# Patient Record
Sex: Male | Born: 1940 | Race: White | Hispanic: No | Marital: Married | Smoking: Former smoker
Health system: Southern US, Community
[De-identification: ages and names within clinical notes are randomized; demographics above are authoritative.]

## PROBLEM LIST (undated history)

## (undated) DIAGNOSIS — K225 Diverticulum of esophagus, acquired: Secondary | ICD-10-CM

## (undated) DIAGNOSIS — T7840XA Allergy, unspecified, initial encounter: Secondary | ICD-10-CM

## (undated) DIAGNOSIS — K219 Gastro-esophageal reflux disease without esophagitis: Secondary | ICD-10-CM

## (undated) DIAGNOSIS — E785 Hyperlipidemia, unspecified: Secondary | ICD-10-CM

## (undated) DIAGNOSIS — M109 Gout, unspecified: Secondary | ICD-10-CM

## (undated) DIAGNOSIS — M1990 Unspecified osteoarthritis, unspecified site: Secondary | ICD-10-CM

## (undated) HISTORY — PX: COLONOSCOPY: SHX174

## (undated) HISTORY — DX: Diverticulum of esophagus, acquired: K22.5

## (undated) HISTORY — DX: Hyperlipidemia, unspecified: E78.5

## (undated) HISTORY — DX: Gout, unspecified: M10.9

## (undated) HISTORY — DX: Allergy, unspecified, initial encounter: T78.40XA

## (undated) HISTORY — PX: HERNIA REPAIR: SHX51

## (undated) HISTORY — DX: Unspecified osteoarthritis, unspecified site: M19.90

## (undated) HISTORY — DX: Gastro-esophageal reflux disease without esophagitis: K21.9

---

## 2002-08-30 ENCOUNTER — Ambulatory Visit (HOSPITAL_COMMUNITY): Admission: RE | Admit: 2002-08-30 | Discharge: 2002-08-30 | Payer: Self-pay | Admitting: *Deleted

## 2014-03-10 ENCOUNTER — Other Ambulatory Visit: Payer: Self-pay | Admitting: Family Medicine

## 2014-03-10 ENCOUNTER — Ambulatory Visit
Admission: RE | Admit: 2014-03-10 | Discharge: 2014-03-10 | Disposition: A | Discharge status: Home / Self Care | Payer: Medicare Other | Source: Ambulatory Visit | Attending: Family Medicine | Admitting: Family Medicine

## 2014-03-10 DIAGNOSIS — R52 Pain, unspecified: Secondary | ICD-10-CM

## 2014-10-30 ENCOUNTER — Other Ambulatory Visit: Payer: Self-pay | Admitting: Family Medicine

## 2014-10-30 DIAGNOSIS — R229 Localized swelling, mass and lump, unspecified: Principal | ICD-10-CM

## 2014-10-30 DIAGNOSIS — IMO0002 Reserved for concepts with insufficient information to code with codable children: Secondary | ICD-10-CM

## 2014-11-09 ENCOUNTER — Ambulatory Visit
Admission: RE | Admit: 2014-11-09 | Discharge: 2014-11-09 | Disposition: A | Discharge status: Home / Self Care | Payer: Medicare Other | Source: Ambulatory Visit | Attending: Family Medicine | Admitting: Family Medicine

## 2014-11-09 DIAGNOSIS — IMO0002 Reserved for concepts with insufficient information to code with codable children: Secondary | ICD-10-CM

## 2014-11-09 DIAGNOSIS — R229 Localized swelling, mass and lump, unspecified: Principal | ICD-10-CM

## 2014-11-09 MED ORDER — GADOBENATE DIMEGLUMINE 529 MG/ML IV SOLN
15.0000 mL | Freq: Once | INTRAVENOUS | Status: AC | PRN
  Administered 2014-11-09: 15 mL via INTRAVENOUS

## 2015-06-14 DIAGNOSIS — R69 Illness, unspecified: Secondary | ICD-10-CM | POA: Diagnosis not present

## 2015-08-11 DIAGNOSIS — J019 Acute sinusitis, unspecified: Secondary | ICD-10-CM | POA: Diagnosis not present

## 2015-08-22 DIAGNOSIS — R7301 Impaired fasting glucose: Secondary | ICD-10-CM | POA: Diagnosis not present

## 2015-08-22 DIAGNOSIS — R972 Elevated prostate specific antigen [PSA]: Secondary | ICD-10-CM | POA: Diagnosis not present

## 2015-08-22 DIAGNOSIS — Z125 Encounter for screening for malignant neoplasm of prostate: Secondary | ICD-10-CM | POA: Diagnosis not present

## 2015-08-22 DIAGNOSIS — Z0000 Encounter for general adult medical examination without abnormal findings: Secondary | ICD-10-CM | POA: Diagnosis not present

## 2015-08-22 DIAGNOSIS — E782 Mixed hyperlipidemia: Secondary | ICD-10-CM | POA: Diagnosis not present

## 2015-08-22 DIAGNOSIS — Z79899 Other long term (current) drug therapy: Secondary | ICD-10-CM | POA: Diagnosis not present

## 2015-08-22 DIAGNOSIS — M109 Gout, unspecified: Secondary | ICD-10-CM | POA: Diagnosis not present

## 2015-08-22 DIAGNOSIS — D696 Thrombocytopenia, unspecified: Secondary | ICD-10-CM | POA: Diagnosis not present

## 2015-11-07 DIAGNOSIS — R972 Elevated prostate specific antigen [PSA]: Secondary | ICD-10-CM | POA: Diagnosis not present

## 2015-11-07 DIAGNOSIS — M109 Gout, unspecified: Secondary | ICD-10-CM | POA: Diagnosis not present

## 2015-11-07 DIAGNOSIS — E782 Mixed hyperlipidemia: Secondary | ICD-10-CM | POA: Diagnosis not present

## 2015-11-07 DIAGNOSIS — R7301 Impaired fasting glucose: Secondary | ICD-10-CM | POA: Diagnosis not present

## 2015-11-07 DIAGNOSIS — D696 Thrombocytopenia, unspecified: Secondary | ICD-10-CM | POA: Diagnosis not present

## 2015-11-16 DIAGNOSIS — E782 Mixed hyperlipidemia: Secondary | ICD-10-CM | POA: Diagnosis not present

## 2015-11-16 DIAGNOSIS — D696 Thrombocytopenia, unspecified: Secondary | ICD-10-CM | POA: Diagnosis not present

## 2015-11-16 DIAGNOSIS — R7301 Impaired fasting glucose: Secondary | ICD-10-CM | POA: Diagnosis not present

## 2015-11-16 DIAGNOSIS — H40013 Open angle with borderline findings, low risk, bilateral: Secondary | ICD-10-CM | POA: Diagnosis not present

## 2015-11-16 DIAGNOSIS — N400 Enlarged prostate without lower urinary tract symptoms: Secondary | ICD-10-CM | POA: Diagnosis not present

## 2015-11-16 DIAGNOSIS — Z0000 Encounter for general adult medical examination without abnormal findings: Secondary | ICD-10-CM | POA: Diagnosis not present

## 2015-11-16 DIAGNOSIS — R972 Elevated prostate specific antigen [PSA]: Secondary | ICD-10-CM | POA: Diagnosis not present

## 2015-11-16 DIAGNOSIS — Z8739 Personal history of other diseases of the musculoskeletal system and connective tissue: Secondary | ICD-10-CM | POA: Diagnosis not present

## 2015-11-16 DIAGNOSIS — H2513 Age-related nuclear cataract, bilateral: Secondary | ICD-10-CM | POA: Diagnosis not present

## 2015-11-16 DIAGNOSIS — H6120 Impacted cerumen, unspecified ear: Secondary | ICD-10-CM | POA: Diagnosis not present

## 2015-12-17 DIAGNOSIS — R69 Illness, unspecified: Secondary | ICD-10-CM | POA: Diagnosis not present

## 2016-01-11 DIAGNOSIS — N401 Enlarged prostate with lower urinary tract symptoms: Secondary | ICD-10-CM | POA: Diagnosis not present

## 2016-01-11 DIAGNOSIS — R3912 Poor urinary stream: Secondary | ICD-10-CM | POA: Diagnosis not present

## 2016-01-11 DIAGNOSIS — R3915 Urgency of urination: Secondary | ICD-10-CM | POA: Diagnosis not present

## 2016-01-11 DIAGNOSIS — R972 Elevated prostate specific antigen [PSA]: Secondary | ICD-10-CM | POA: Diagnosis not present

## 2016-01-17 DIAGNOSIS — Z23 Encounter for immunization: Secondary | ICD-10-CM | POA: Diagnosis not present

## 2016-03-13 DIAGNOSIS — Z872 Personal history of diseases of the skin and subcutaneous tissue: Secondary | ICD-10-CM | POA: Diagnosis not present

## 2016-03-13 DIAGNOSIS — D1801 Hemangioma of skin and subcutaneous tissue: Secondary | ICD-10-CM | POA: Diagnosis not present

## 2016-03-13 DIAGNOSIS — L814 Other melanin hyperpigmentation: Secondary | ICD-10-CM | POA: Diagnosis not present

## 2016-03-13 DIAGNOSIS — L821 Other seborrheic keratosis: Secondary | ICD-10-CM | POA: Diagnosis not present

## 2016-03-13 DIAGNOSIS — Z85828 Personal history of other malignant neoplasm of skin: Secondary | ICD-10-CM | POA: Diagnosis not present

## 2016-06-26 DIAGNOSIS — R69 Illness, unspecified: Secondary | ICD-10-CM | POA: Diagnosis not present

## 2016-11-18 DIAGNOSIS — H2513 Age-related nuclear cataract, bilateral: Secondary | ICD-10-CM | POA: Diagnosis not present

## 2016-11-18 DIAGNOSIS — H52223 Regular astigmatism, bilateral: Secondary | ICD-10-CM | POA: Diagnosis not present

## 2016-11-18 DIAGNOSIS — H40013 Open angle with borderline findings, low risk, bilateral: Secondary | ICD-10-CM | POA: Diagnosis not present

## 2016-11-25 DIAGNOSIS — Z125 Encounter for screening for malignant neoplasm of prostate: Secondary | ICD-10-CM | POA: Diagnosis not present

## 2016-11-25 DIAGNOSIS — M109 Gout, unspecified: Secondary | ICD-10-CM | POA: Diagnosis not present

## 2016-11-25 DIAGNOSIS — Z79899 Other long term (current) drug therapy: Secondary | ICD-10-CM | POA: Diagnosis not present

## 2016-11-25 DIAGNOSIS — E782 Mixed hyperlipidemia: Secondary | ICD-10-CM | POA: Diagnosis not present

## 2016-11-28 DIAGNOSIS — R972 Elevated prostate specific antigen [PSA]: Secondary | ICD-10-CM | POA: Diagnosis not present

## 2016-11-28 DIAGNOSIS — Z6827 Body mass index (BMI) 27.0-27.9, adult: Secondary | ICD-10-CM | POA: Diagnosis not present

## 2016-11-28 DIAGNOSIS — E782 Mixed hyperlipidemia: Secondary | ICD-10-CM | POA: Diagnosis not present

## 2016-11-28 DIAGNOSIS — D696 Thrombocytopenia, unspecified: Secondary | ICD-10-CM | POA: Diagnosis not present

## 2016-11-28 DIAGNOSIS — Z0000 Encounter for general adult medical examination without abnormal findings: Secondary | ICD-10-CM | POA: Diagnosis not present

## 2016-11-28 DIAGNOSIS — Z8739 Personal history of other diseases of the musculoskeletal system and connective tissue: Secondary | ICD-10-CM | POA: Diagnosis not present

## 2016-11-28 DIAGNOSIS — E663 Overweight: Secondary | ICD-10-CM | POA: Diagnosis not present

## 2016-11-28 DIAGNOSIS — Z79899 Other long term (current) drug therapy: Secondary | ICD-10-CM | POA: Diagnosis not present

## 2016-11-28 DIAGNOSIS — H6121 Impacted cerumen, right ear: Secondary | ICD-10-CM | POA: Diagnosis not present

## 2016-11-28 DIAGNOSIS — R7301 Impaired fasting glucose: Secondary | ICD-10-CM | POA: Diagnosis not present

## 2017-02-09 DIAGNOSIS — R69 Illness, unspecified: Secondary | ICD-10-CM | POA: Diagnosis not present

## 2017-03-02 DIAGNOSIS — R7301 Impaired fasting glucose: Secondary | ICD-10-CM | POA: Diagnosis not present

## 2017-03-02 DIAGNOSIS — E782 Mixed hyperlipidemia: Secondary | ICD-10-CM | POA: Diagnosis not present

## 2017-03-02 DIAGNOSIS — R972 Elevated prostate specific antigen [PSA]: Secondary | ICD-10-CM | POA: Diagnosis not present

## 2017-03-02 DIAGNOSIS — Z79899 Other long term (current) drug therapy: Secondary | ICD-10-CM | POA: Diagnosis not present

## 2017-03-02 DIAGNOSIS — Z0000 Encounter for general adult medical examination without abnormal findings: Secondary | ICD-10-CM | POA: Diagnosis not present

## 2017-03-02 DIAGNOSIS — D696 Thrombocytopenia, unspecified: Secondary | ICD-10-CM | POA: Diagnosis not present

## 2017-03-02 DIAGNOSIS — H6120 Impacted cerumen, unspecified ear: Secondary | ICD-10-CM | POA: Diagnosis not present

## 2017-03-02 DIAGNOSIS — Z8739 Personal history of other diseases of the musculoskeletal system and connective tissue: Secondary | ICD-10-CM | POA: Diagnosis not present

## 2017-03-09 DIAGNOSIS — R3912 Poor urinary stream: Secondary | ICD-10-CM | POA: Diagnosis not present

## 2017-03-09 DIAGNOSIS — R972 Elevated prostate specific antigen [PSA]: Secondary | ICD-10-CM | POA: Diagnosis not present

## 2017-03-09 DIAGNOSIS — N401 Enlarged prostate with lower urinary tract symptoms: Secondary | ICD-10-CM | POA: Diagnosis not present

## 2017-04-27 DIAGNOSIS — L814 Other melanin hyperpigmentation: Secondary | ICD-10-CM | POA: Diagnosis not present

## 2017-04-27 DIAGNOSIS — D1801 Hemangioma of skin and subcutaneous tissue: Secondary | ICD-10-CM | POA: Diagnosis not present

## 2017-04-27 DIAGNOSIS — Z872 Personal history of diseases of the skin and subcutaneous tissue: Secondary | ICD-10-CM | POA: Diagnosis not present

## 2017-04-27 DIAGNOSIS — C44329 Squamous cell carcinoma of skin of other parts of face: Secondary | ICD-10-CM | POA: Diagnosis not present

## 2017-04-27 DIAGNOSIS — Z85828 Personal history of other malignant neoplasm of skin: Secondary | ICD-10-CM | POA: Diagnosis not present

## 2017-04-27 DIAGNOSIS — L821 Other seborrheic keratosis: Secondary | ICD-10-CM | POA: Diagnosis not present

## 2017-04-27 DIAGNOSIS — D485 Neoplasm of uncertain behavior of skin: Secondary | ICD-10-CM | POA: Diagnosis not present

## 2017-05-26 DIAGNOSIS — L905 Scar conditions and fibrosis of skin: Secondary | ICD-10-CM | POA: Diagnosis not present

## 2017-05-26 DIAGNOSIS — C44329 Squamous cell carcinoma of skin of other parts of face: Secondary | ICD-10-CM | POA: Diagnosis not present

## 2017-07-30 ENCOUNTER — Ambulatory Visit (INDEPENDENT_AMBULATORY_CARE_PROVIDER_SITE_OTHER): Payer: MEDICARE | Admitting: Orthopaedic Surgery

## 2017-07-30 ENCOUNTER — Encounter (INDEPENDENT_AMBULATORY_CARE_PROVIDER_SITE_OTHER): Payer: Self-pay

## 2017-07-30 ENCOUNTER — Ambulatory Visit (INDEPENDENT_AMBULATORY_CARE_PROVIDER_SITE_OTHER): Payer: MEDICARE

## 2017-07-30 ENCOUNTER — Encounter (INDEPENDENT_AMBULATORY_CARE_PROVIDER_SITE_OTHER): Payer: Self-pay | Admitting: Orthopaedic Surgery

## 2017-07-30 VITALS — BP 140/65 | HR 79 | Ht 68.0 in | Wt 175.0 lb

## 2017-07-30 DIAGNOSIS — M1712 Unilateral primary osteoarthritis, left knee: Secondary | ICD-10-CM | POA: Diagnosis not present

## 2017-07-30 DIAGNOSIS — M25562 Pain in left knee: Secondary | ICD-10-CM | POA: Diagnosis not present

## 2017-07-30 MED ORDER — METHYLPREDNISOLONE ACETATE 40 MG/ML IJ SUSP
80.0000 mg | INTRAMUSCULAR | Status: AC | PRN
  Administered 2017-07-30: 80 mg

## 2017-07-30 MED ORDER — BUPIVACAINE HCL 0.5 % IJ SOLN
2.0000 mL | INTRAMUSCULAR | Status: AC | PRN
  Administered 2017-07-30: 2 mL via INTRA_ARTICULAR

## 2017-07-30 MED ORDER — LIDOCAINE HCL 1 % IJ SOLN
2.0000 mL | INTRAMUSCULAR | Status: AC | PRN
  Administered 2017-07-30: 2 mL

## 2017-07-30 NOTE — Progress Notes (Signed)
Office Visit Note   Patient: Gary Arroyo           Date of Birth: 1940-12-14           MRN: [id number] Visit Date: 07/30/2017              Requested by: No referring provider defined for this encounter. PCP: Hulan Fess, MD   Assessment & Plan: Visit Diagnoses:  1. Unilateral primary osteoarthritis, left knee   2. Left knee pain, unspecified chronicity     Plan:  #1: Corticosteroid injection to the left knee was accomplished and he had improvement in his symptoms. 2: If he does not have improvement he will call and we will schedule an MRI scan of his left knee.   Follow-Up Instructions: Return if symptoms worsen or fail to improve.   Orders:  Orders Placed This Encounter  Procedures  . Large Joint Inj: L knee  . XR Knee 1-2 Views Left   No orders of the defined types were placed in this encounter.     Procedures: Large Joint Inj: L knee on 07/30/2017 11:08 AM Indications: pain and diagnostic evaluation Details: 25 G 1.5 in needle, anteromedial approach  Arthrogram: No  Medications: 2 mL lidocaine 1 %; 2 mL bupivacaine 0.5 %; 80 mg methylPREDNISolone acetate 40 MG/ML Procedure, treatment alternatives, risks and benefits explained, specific risks discussed. Consent was given by the patient. Patient was prepped and draped in the usual sterile fashion.       Clinical Data: No additional findings.   Subjective: Chief Complaint  Patient presents with  . Left Knee - Pain  . Knee Pain    Pt stated Lt knee painful especially at night/walking    HPI  Gary Arroyo is a very pleasant 77 year old white male who is seen today for evaluation of his left knee.  He started having pain discomfort in the left knee especially at nighttime.  This discomfort was mainly along the medial aspect of the left knee.  He denies any history of injury or trauma.  It actually bothers him is also as he is walking.  He denies any catching locking or popping or giving way at this time.  No  hip or groin pain.  Review of Systems   Objective: Vital Signs: BP 140/65   Pulse 79   Ht 5\' 8"  (1.727 m)   Wt 175 lb (79.4 kg)   BMI 26.61 kg/m   Physical Exam  Constitutional: He is oriented to person, place, and time. He appears well-developed and well-nourished.  HENT:  Head: Normocephalic and atraumatic.  Eyes: Pupils are equal, round, and reactive to light. EOM are normal.  Pulmonary/Chest: Effort normal.  Neurological: He is alert and oriented to person, place, and time.  Skin: Skin is warm and dry.  Psychiatric: He has a normal mood and affect. His behavior is normal. Judgment and thought content normal.    Ortho Exam  Today he has range of motion a few degrees shy of full extension to 100 degrees.  Tender to palpation over the medial joint line.  McMurray's is mildly positive.  Ligamentously stable though has a little bit of opening with valgus stressing which has a good endpoint.  Trace effusion.  Calf supple nontender.  Good motion of the both hips.  Specialty Comments:  No specialty comments available.  Imaging: Xr Knee 1-2 Views Left  Result Date: 07/30/2017 2 view x-ray of the left knee reveals decreased joint space of the medial compartment.  Still maintaining good joint space.  Little bit of a periarticular spur on the lateral proximal tibial plateau.    PMFS History: There are no active problems to display for this patient.  No past medical history on file.  No family history on file.   Social History   Occupational History  . Not on file  Tobacco Use  . Smoking status: Never Smoker  . Smokeless tobacco: Never Used  Substance and Sexual Activity  . Alcohol use: Yes    Comment: once every 2 years  . Drug use: Not Currently  . Sexual activity: Not Currently

## 2017-09-09 ENCOUNTER — Telehealth (INDEPENDENT_AMBULATORY_CARE_PROVIDER_SITE_OTHER): Payer: Self-pay | Admitting: Orthopaedic Surgery

## 2017-09-09 NOTE — Telephone Encounter (Signed)
Per wife, patient had injection 6 weeks ago. Lt. knee is no better, and would like to proceed with MRI. Please call to advise.

## 2017-09-10 NOTE — Telephone Encounter (Signed)
Please advise 

## 2017-09-11 ENCOUNTER — Other Ambulatory Visit (INDEPENDENT_AMBULATORY_CARE_PROVIDER_SITE_OTHER): Payer: Self-pay | Admitting: Radiology

## 2017-09-11 DIAGNOSIS — G8929 Other chronic pain: Secondary | ICD-10-CM

## 2017-09-11 DIAGNOSIS — M25562 Pain in left knee: Principal | ICD-10-CM

## 2017-09-11 NOTE — Telephone Encounter (Signed)
SENT L KNEE MRI REFERRAL IN

## 2017-09-11 NOTE — Telephone Encounter (Signed)
Ok for MRI.

## 2017-09-19 ENCOUNTER — Ambulatory Visit
Admission: RE | Admit: 2017-09-19 | Discharge: 2017-09-19 | Disposition: A | Discharge status: Home / Self Care | Payer: Medicare Other | Source: Ambulatory Visit | Attending: Orthopaedic Surgery | Admitting: Orthopaedic Surgery

## 2017-09-19 DIAGNOSIS — M25562 Pain in left knee: Secondary | ICD-10-CM | POA: Diagnosis not present

## 2017-09-19 DIAGNOSIS — G8929 Other chronic pain: Secondary | ICD-10-CM

## 2017-09-25 ENCOUNTER — Encounter (INDEPENDENT_AMBULATORY_CARE_PROVIDER_SITE_OTHER): Payer: Self-pay | Admitting: Orthopaedic Surgery

## 2017-09-25 ENCOUNTER — Ambulatory Visit (INDEPENDENT_AMBULATORY_CARE_PROVIDER_SITE_OTHER): Payer: MEDICARE | Admitting: Orthopaedic Surgery

## 2017-09-25 VITALS — BP 150/81 | HR 74 | Resp 18 | Ht 68.0 in | Wt 175.0 lb

## 2017-09-25 DIAGNOSIS — G8929 Other chronic pain: Secondary | ICD-10-CM | POA: Diagnosis not present

## 2017-09-25 DIAGNOSIS — M25562 Pain in left knee: Secondary | ICD-10-CM | POA: Diagnosis not present

## 2017-09-25 NOTE — Progress Notes (Signed)
Office Visit Note   Patient: Gary Arroyo           Date of Birth: March 22, 1941           MRN: [id number] Visit Date: 09/25/2017              Requested by: Hulan Fess, MD [place], [place] [address] PCP: Hulan Fess, MD   Assessment & Plan: Visit Diagnoses:  1. Chronic pain of left knee    .  Long discussion with Mr. Mrs. Benjamine Mola and Plan: MRI scan demonstrates a complex tear of the posterior body and posterior horn of the medial meniscus with a radial component.  The lateral meniscus is intact.  Ligaments are intact.  There is partial thickness cartilage loss of the patellofemoral compartment with areas of full-thickness cartilage loss and subchondral reactive marrow changes.  The medial compartment there was partial thickness cartilage loss of the medial femoral-tibial compartment.  Laterally no chondral defects.  There was a small Baker's cyst.  The medial femoral condyle weightbearing surface there was severe surrounding marrow edema most consistent with a subchondral insufficiency fracture with adjacent soft tissue edema.  Long discussion with Mr. Mrs. Boxley regarding the findings.  He is minimally symptomatic at this point.  We discussed NSAIDs, exercises, pullover knee support.  Recurrent injections and even knee arthroscopy.  No further questions.  We will plan to see him back as needed  Follow-Up Instructions: Return if symptoms worsen or fail to improve.   Orders:  No orders of the defined types were placed in this encounter.  No orders of the defined types were placed in this encounter.     Procedures: No procedures performed   Clinical Data: No additional findings.   Subjective: Chief Complaint  Patient presents with  . Left Knee - Follow-up  . Follow-up    MRI REVIEW LEFT KNEE  Feeling much better.  Still has some discomfort at night but not having much swelling and no significant limitation of activities.  MRI scan findings as  above  HPI  Review of Systems   Objective: Vital Signs: BP (!) 150/81 (BP Location: Left Arm, Patient Position: Sitting, Cuff Size: Normal)   Pulse 74   Resp 18   Ht 5\' 8"  (1.727 m)   Wt 175 lb (79.4 kg)   BMI 26.61 kg/m   Physical Exam  Constitutional: He is oriented to person, place, and time. He appears well-developed and well-nourished.  HENT:  Mouth/Throat: Oropharynx is clear and moist.  Eyes: Pupils are equal, round, and reactive to light. EOM are normal.  Pulmonary/Chest: Effort normal.  Neurological: He is alert and oriented to person, place, and time.  Skin: Skin is warm and dry.  Psychiatric: He has a normal mood and affect. His behavior is normal.    Ortho Exam awake alert and oriented x3.  Comfortable sitting.  Walks without a limp.  No left knee effusion.  Minimal discomfort along the medial compartment left knee without popping or clicking.  No instability.  Full extension and over 105 degrees of flexion.  No popliteal pain.  No calf pain.  No distal edema.  Neurovascular exam intact.  No pain along the proximal medial tibia with skin intact.  No ecchymosis or erythema.  No hip pain.  No thigh pain.  Straight leg raise negative  Specialty Comments:  No specialty comments available.  Imaging: No results found.   PMFS History: There are no active problems to display for this  patient.  History reviewed. No pertinent past medical history.  History reviewed. No pertinent family history.  History reviewed. No pertinent surgical history. Social History   Occupational History  . Not on file  Tobacco Use  . Smoking status: Former Smoker    Last attempt to quit: 1974    Years since quitting: 45.4  . Smokeless tobacco: Never Used  Substance and Sexual Activity  . Alcohol use: Yes    Comment: once every 2 years  . Drug use: Not Currently  . Sexual activity: Not Currently     Garald Balding, MD   Note - This record has been created using [employer].  Chart creation errors have been sought, but may not always  have been located. Such creation errors do not reflect on  the standard of medical care.

## 2017-11-20 DIAGNOSIS — H52223 Regular astigmatism, bilateral: Secondary | ICD-10-CM | POA: Diagnosis not present

## 2017-11-20 DIAGNOSIS — H524 Presbyopia: Secondary | ICD-10-CM | POA: Diagnosis not present

## 2017-11-20 DIAGNOSIS — H5213 Myopia, bilateral: Secondary | ICD-10-CM | POA: Diagnosis not present

## 2017-12-24 DIAGNOSIS — Z8739 Personal history of other diseases of the musculoskeletal system and connective tissue: Secondary | ICD-10-CM | POA: Diagnosis not present

## 2017-12-24 DIAGNOSIS — E782 Mixed hyperlipidemia: Secondary | ICD-10-CM | POA: Diagnosis not present

## 2017-12-24 DIAGNOSIS — Z79899 Other long term (current) drug therapy: Secondary | ICD-10-CM | POA: Diagnosis not present

## 2017-12-30 ENCOUNTER — Telehealth (INDEPENDENT_AMBULATORY_CARE_PROVIDER_SITE_OTHER): Payer: Self-pay | Admitting: Orthopaedic Surgery

## 2017-12-30 DIAGNOSIS — Z79899 Other long term (current) drug therapy: Secondary | ICD-10-CM | POA: Diagnosis not present

## 2017-12-30 DIAGNOSIS — Z8739 Personal history of other diseases of the musculoskeletal system and connective tissue: Secondary | ICD-10-CM | POA: Diagnosis not present

## 2017-12-30 DIAGNOSIS — Z1389 Encounter for screening for other disorder: Secondary | ICD-10-CM | POA: Diagnosis not present

## 2017-12-30 DIAGNOSIS — N400 Enlarged prostate without lower urinary tract symptoms: Secondary | ICD-10-CM | POA: Diagnosis not present

## 2017-12-30 DIAGNOSIS — D696 Thrombocytopenia, unspecified: Secondary | ICD-10-CM | POA: Diagnosis not present

## 2017-12-30 DIAGNOSIS — E782 Mixed hyperlipidemia: Secondary | ICD-10-CM | POA: Diagnosis not present

## 2017-12-30 DIAGNOSIS — R7301 Impaired fasting glucose: Secondary | ICD-10-CM | POA: Diagnosis not present

## 2017-12-30 DIAGNOSIS — Z0000 Encounter for general adult medical examination without abnormal findings: Secondary | ICD-10-CM | POA: Diagnosis not present

## 2017-12-30 NOTE — Telephone Encounter (Signed)
Last ov note & MRI report faxed to Jan @ Dr. Hulan Fess office [phone]

## 2018-01-23 DIAGNOSIS — R69 Illness, unspecified: Secondary | ICD-10-CM | POA: Diagnosis not present

## 2018-01-26 DIAGNOSIS — L309 Dermatitis, unspecified: Secondary | ICD-10-CM | POA: Diagnosis not present

## 2018-03-11 DIAGNOSIS — N401 Enlarged prostate with lower urinary tract symptoms: Secondary | ICD-10-CM | POA: Diagnosis not present

## 2018-03-11 DIAGNOSIS — R3912 Poor urinary stream: Secondary | ICD-10-CM | POA: Diagnosis not present

## 2018-03-11 DIAGNOSIS — R972 Elevated prostate specific antigen [PSA]: Secondary | ICD-10-CM | POA: Diagnosis not present

## 2018-04-27 DIAGNOSIS — D1801 Hemangioma of skin and subcutaneous tissue: Secondary | ICD-10-CM | POA: Diagnosis not present

## 2018-04-27 DIAGNOSIS — L570 Actinic keratosis: Secondary | ICD-10-CM | POA: Diagnosis not present

## 2018-04-27 DIAGNOSIS — Z85828 Personal history of other malignant neoplasm of skin: Secondary | ICD-10-CM | POA: Diagnosis not present

## 2018-04-27 DIAGNOSIS — L821 Other seborrheic keratosis: Secondary | ICD-10-CM | POA: Diagnosis not present

## 2018-04-27 DIAGNOSIS — D485 Neoplasm of uncertain behavior of skin: Secondary | ICD-10-CM | POA: Diagnosis not present

## 2018-06-29 DIAGNOSIS — L570 Actinic keratosis: Secondary | ICD-10-CM | POA: Diagnosis not present

## 2018-06-29 DIAGNOSIS — L821 Other seborrheic keratosis: Secondary | ICD-10-CM | POA: Diagnosis not present

## 2018-07-14 DIAGNOSIS — R69 Illness, unspecified: Secondary | ICD-10-CM | POA: Diagnosis not present

## 2018-07-19 IMAGING — MR MR KNEE*L* W/O CM
6 of 7 series · 33 of 40 positions shown · non-contrast
Comparison: None.

CLINICAL DATA: Chronic left knee pain.  Popping and swelling.

EXAM:
MRI OF THE LEFT KNEE WITHOUT CONTRAST
TECHNIQUE: Multiplanar, multisequence MR imaging of the knee was performed. No
intravenous contrast was administered.

[Series 6: PD fat-sat · axial · left · 3.0mm · 0.39mm/px · z∈[-92,+39]mm · 6 of 41 slices shown (1 of 4)]
[im 1/41]
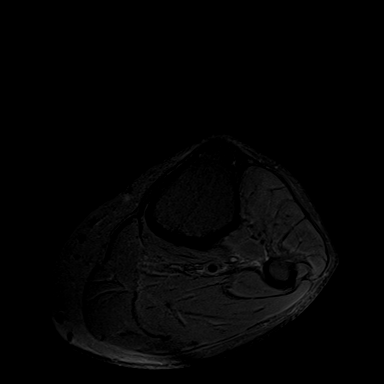
[im 9/41]
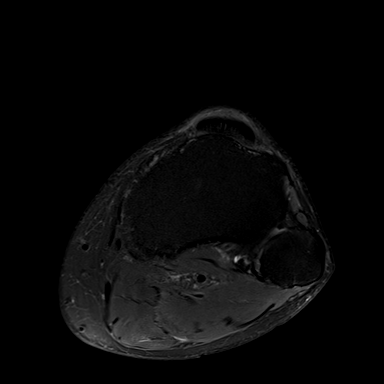
[im 17/41]
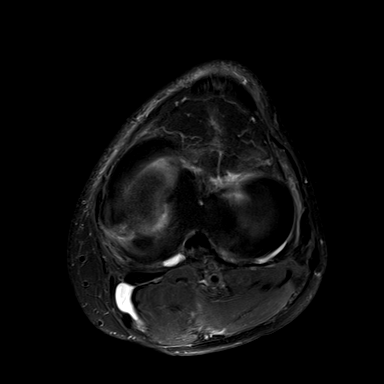
[im 25/41]
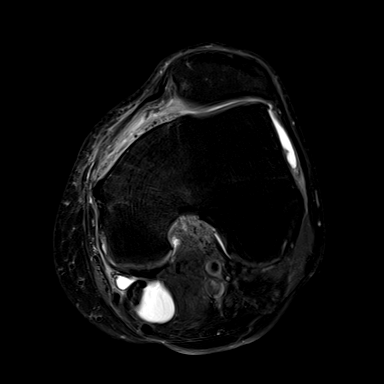
[im 33/41]
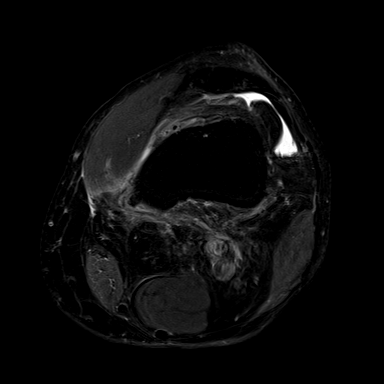
[im 41/41]
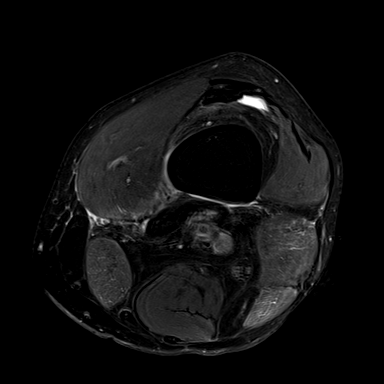

[Series 7: PD fat-sat · coronal · left · 3.0mm · 0.33mm/px · 6 of 40 slices shown (2 of 4)]
[im 1/40]
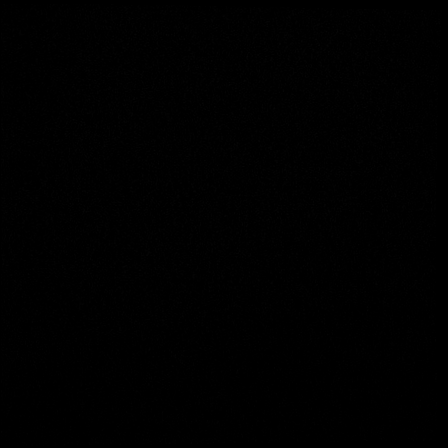
[im 8/40]
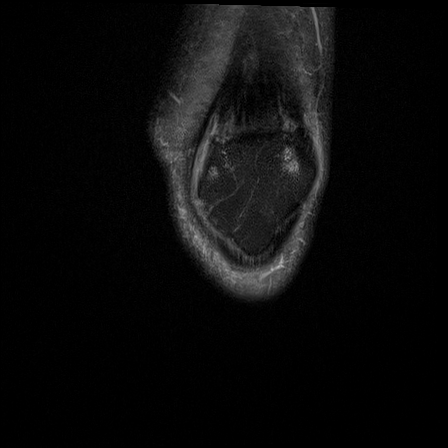
[im 16/40]
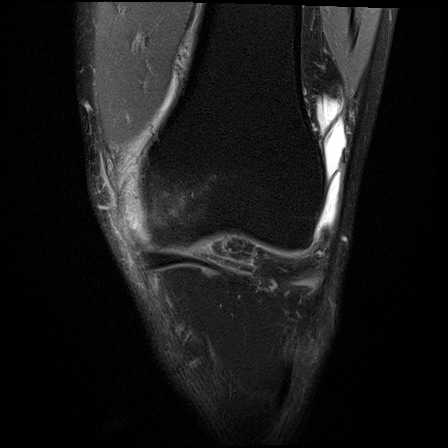
[im 24/40]
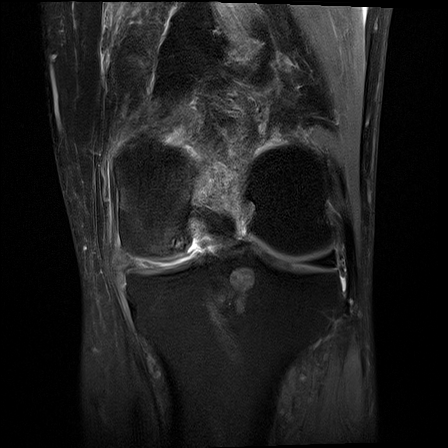
[im 32/40]
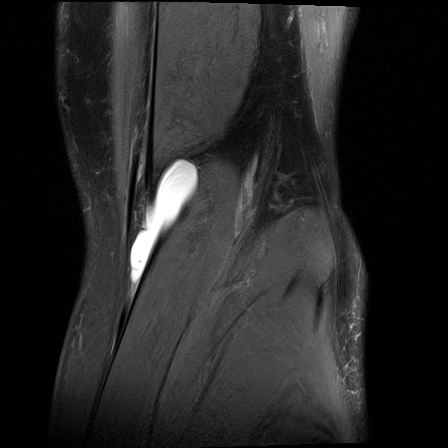
[im 40/40]
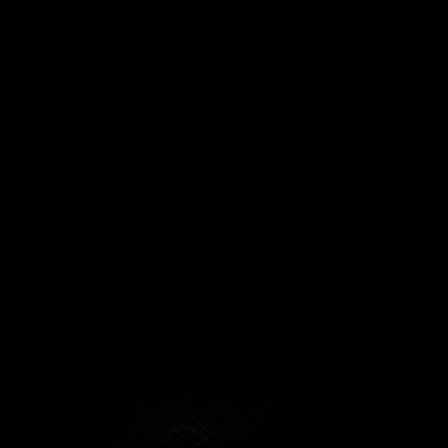

[Series 9: PD fat-sat · sagittal · left · 3.0mm · 0.39mm/px · 5 of 31 slices shown (3 of 4)]
[im 1/31]
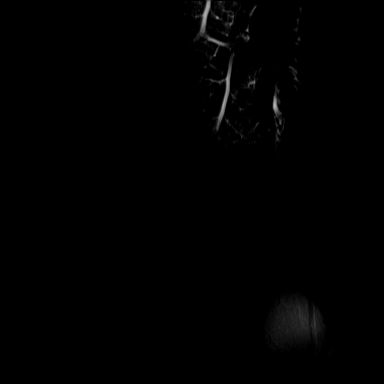
[im 8/31]
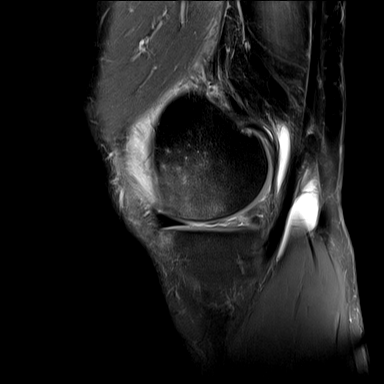
[im 16/31]
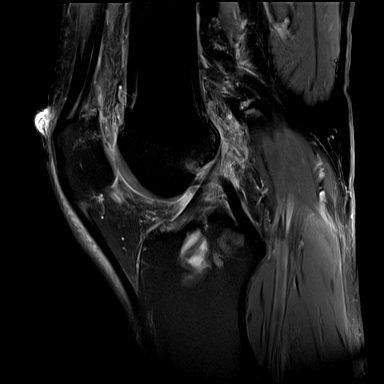
[im 23/31]
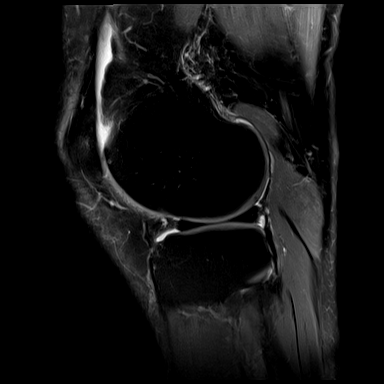
[im 31/31]
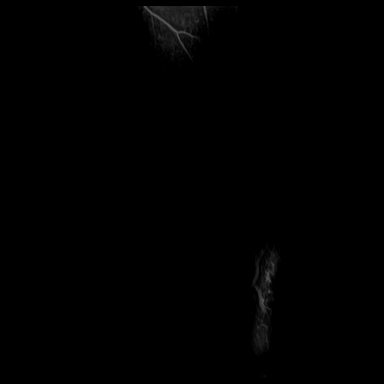

[Series 10: T2 fat-sat · coronal · left · 3.0mm · 0.39mm/px · 7 of 40 slices shown]
[im 1/40]
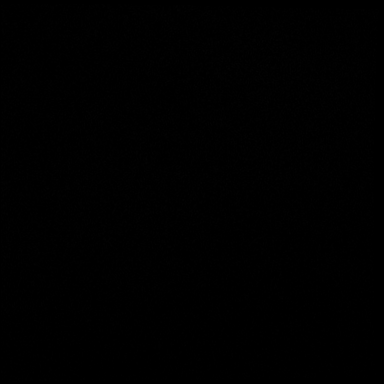
[im 7/40]
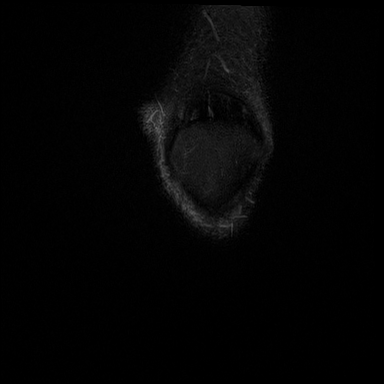
[im 14/40]
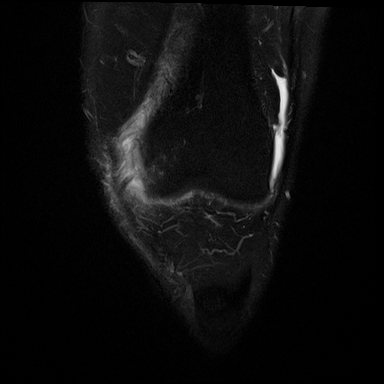
[im 20/40]
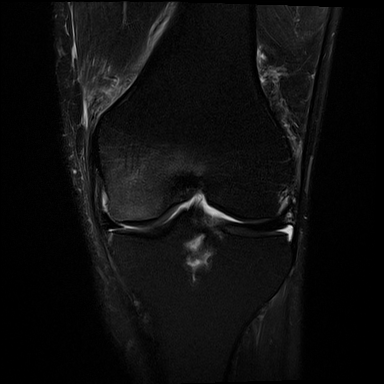
[im 27/40]
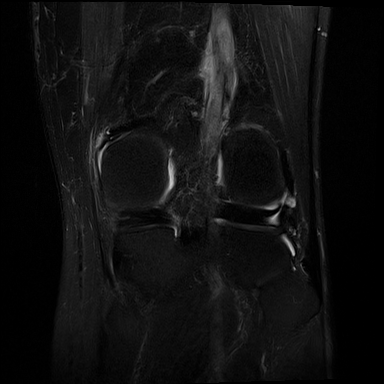
[im 33/40]
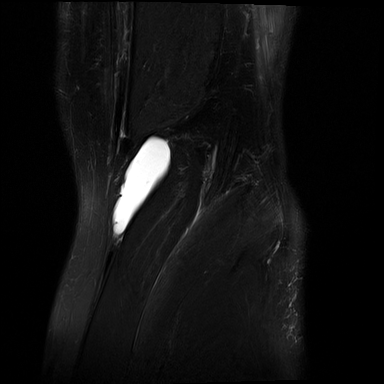
[im 40/40]
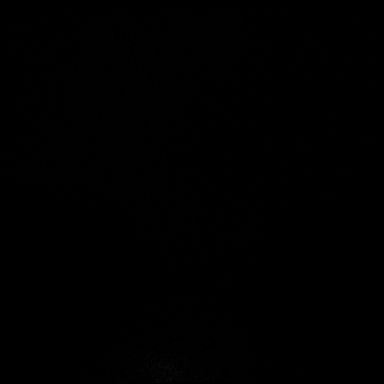

[Series 11: PD · coronal · left · 1.5mm · 0.44mm/px · 4 of 23 slices shown]
[im 1/23]
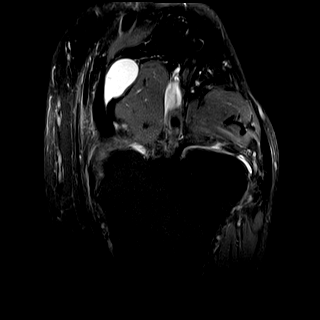
[im 8/23]
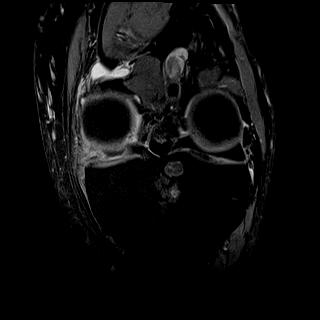
[im 15/23]
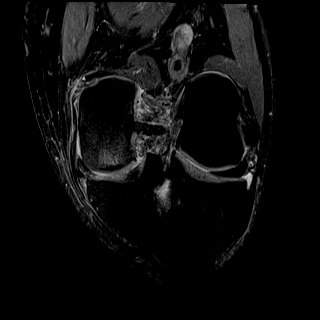
[im 23/23]
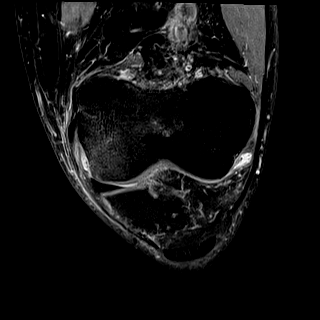

[Series 100: PD fat-sat · sagittal · left · 3.0mm · 0.39mm/px · 5 of 31 slices shown (4 of 4)]
[im 1/31]
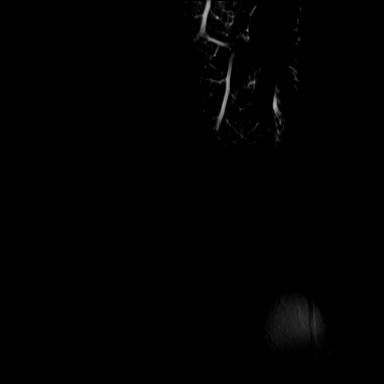
[im 8/31]
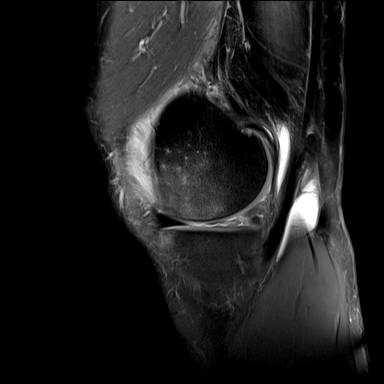
[im 16/31]
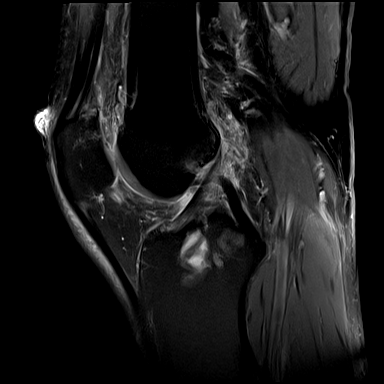
[im 23/31]
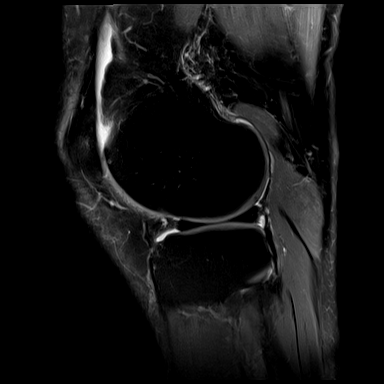
[im 31/31]
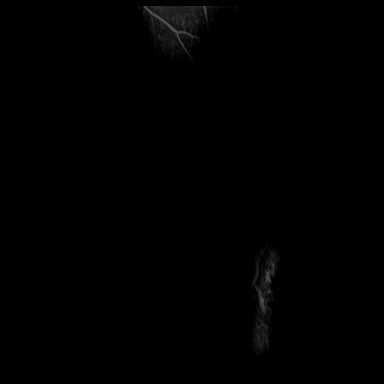

[33 of 40 positions shown; findings below may reference images not displayed]

FINDINGS: MENISCI

Medial meniscus: Complex tear of the posterior body and posterior
horn of the medial meniscus with a radial component.

Lateral meniscus:  Intact.

LIGAMENTS

Cruciates:  Intact ACL and PCL.

Collaterals: Medial collateral ligament is intact. Lateral
collateral ligament complex is intact.

CARTILAGE

Patellofemoral: Partial-thickness cartilage loss of the
patellofemoral compartment with areas of full-thickness cartilage
loss of the patellar apex and lateral patellar facet with
subchondral reactive marrow changes.

Medial: Partial-thickness cartilage loss of the medial femorotibial
compartment.

Lateral:  No chondral defect.

Joint: Small joint effusion. Normal Hoffa's fat. No plical
thickening.

Popliteal Fossa:  Small Baker's cyst.  Intact popliteus tendon.

Extensor Mechanism: Intact quadriceps tendon. Intact patellar
tendon. Intact medial patellar retinaculum. Intact lateral patellar
retinaculum. Intact MPFL.

Bones: Subchondral linear low signal in the medial femoral condyle
weight-bearing surface with severe surrounding marrow edema most
consistent with a subchondral insufficiency fracture with adjacent
soft tissue edema. Intraosseous ganglion cyst at the ACL insertion.

Other: No fluid collection or hematoma.
IMPRESSION: 1. Complex tear of the posterior body and posterior horn of the
medial meniscus with a radial component.
2. Subchondral insufficiency fracture of the medial femoral condyle
with severe surrounding marrow edema.
3. Cartilage abnormalities of the patellofemoral compartment and
medial femorotibial compartment.
4. Small joint effusion.

## 2019-01-14 DIAGNOSIS — H40013 Open angle with borderline findings, low risk, bilateral: Secondary | ICD-10-CM | POA: Diagnosis not present

## 2019-01-14 DIAGNOSIS — H2513 Age-related nuclear cataract, bilateral: Secondary | ICD-10-CM | POA: Diagnosis not present

## 2019-01-14 DIAGNOSIS — H35033 Hypertensive retinopathy, bilateral: Secondary | ICD-10-CM | POA: Diagnosis not present

## 2019-02-15 DIAGNOSIS — R69 Illness, unspecified: Secondary | ICD-10-CM | POA: Diagnosis not present

## 2019-02-16 DIAGNOSIS — R69 Illness, unspecified: Secondary | ICD-10-CM | POA: Diagnosis not present

## 2019-03-04 DIAGNOSIS — R7301 Impaired fasting glucose: Secondary | ICD-10-CM | POA: Diagnosis not present

## 2019-03-04 DIAGNOSIS — D696 Thrombocytopenia, unspecified: Secondary | ICD-10-CM | POA: Diagnosis not present

## 2019-03-04 DIAGNOSIS — Z8739 Personal history of other diseases of the musculoskeletal system and connective tissue: Secondary | ICD-10-CM | POA: Diagnosis not present

## 2019-03-04 DIAGNOSIS — Z79899 Other long term (current) drug therapy: Secondary | ICD-10-CM | POA: Diagnosis not present

## 2019-03-04 DIAGNOSIS — Z8589 Personal history of malignant neoplasm of other organs and systems: Secondary | ICD-10-CM | POA: Diagnosis not present

## 2019-03-04 DIAGNOSIS — E782 Mixed hyperlipidemia: Secondary | ICD-10-CM | POA: Diagnosis not present

## 2019-03-04 DIAGNOSIS — Z0000 Encounter for general adult medical examination without abnormal findings: Secondary | ICD-10-CM | POA: Diagnosis not present

## 2019-03-04 DIAGNOSIS — N400 Enlarged prostate without lower urinary tract symptoms: Secondary | ICD-10-CM | POA: Diagnosis not present

## 2019-03-04 DIAGNOSIS — Z23 Encounter for immunization: Secondary | ICD-10-CM | POA: Diagnosis not present

## 2019-03-15 DIAGNOSIS — S61432A Puncture wound without foreign body of left hand, initial encounter: Secondary | ICD-10-CM | POA: Diagnosis not present

## 2019-03-15 DIAGNOSIS — W450XXA Nail entering through skin, initial encounter: Secondary | ICD-10-CM | POA: Diagnosis not present

## 2019-03-16 DIAGNOSIS — R351 Nocturia: Secondary | ICD-10-CM | POA: Diagnosis not present

## 2019-03-16 DIAGNOSIS — R3912 Poor urinary stream: Secondary | ICD-10-CM | POA: Diagnosis not present

## 2019-03-16 DIAGNOSIS — N401 Enlarged prostate with lower urinary tract symptoms: Secondary | ICD-10-CM | POA: Diagnosis not present

## 2019-04-26 DIAGNOSIS — D1801 Hemangioma of skin and subcutaneous tissue: Secondary | ICD-10-CM | POA: Diagnosis not present

## 2019-04-26 DIAGNOSIS — L570 Actinic keratosis: Secondary | ICD-10-CM | POA: Diagnosis not present

## 2019-04-26 DIAGNOSIS — D225 Melanocytic nevi of trunk: Secondary | ICD-10-CM | POA: Diagnosis not present

## 2019-04-26 DIAGNOSIS — L720 Epidermal cyst: Secondary | ICD-10-CM | POA: Diagnosis not present

## 2019-04-26 DIAGNOSIS — L821 Other seborrheic keratosis: Secondary | ICD-10-CM | POA: Diagnosis not present

## 2019-04-26 DIAGNOSIS — Z85828 Personal history of other malignant neoplasm of skin: Secondary | ICD-10-CM | POA: Diagnosis not present

## 2019-05-12 DIAGNOSIS — R69 Illness, unspecified: Secondary | ICD-10-CM | POA: Diagnosis not present

## 2019-05-29 ENCOUNTER — Ambulatory Visit: Payer: Medicare Other

## 2019-07-06 DIAGNOSIS — D696 Thrombocytopenia, unspecified: Secondary | ICD-10-CM | POA: Diagnosis not present

## 2019-07-06 DIAGNOSIS — N400 Enlarged prostate without lower urinary tract symptoms: Secondary | ICD-10-CM | POA: Diagnosis not present

## 2019-07-06 DIAGNOSIS — R7301 Impaired fasting glucose: Secondary | ICD-10-CM | POA: Diagnosis not present

## 2019-07-06 DIAGNOSIS — Z8589 Personal history of malignant neoplasm of other organs and systems: Secondary | ICD-10-CM | POA: Diagnosis not present

## 2019-07-06 DIAGNOSIS — Z79899 Other long term (current) drug therapy: Secondary | ICD-10-CM | POA: Diagnosis not present

## 2019-07-06 DIAGNOSIS — E782 Mixed hyperlipidemia: Secondary | ICD-10-CM | POA: Diagnosis not present

## 2019-07-06 DIAGNOSIS — Z23 Encounter for immunization: Secondary | ICD-10-CM | POA: Diagnosis not present

## 2019-07-06 DIAGNOSIS — Z0000 Encounter for general adult medical examination without abnormal findings: Secondary | ICD-10-CM | POA: Diagnosis not present

## 2019-07-06 DIAGNOSIS — Z8739 Personal history of other diseases of the musculoskeletal system and connective tissue: Secondary | ICD-10-CM | POA: Diagnosis not present

## 2019-11-24 ENCOUNTER — Other Ambulatory Visit: Payer: Self-pay

## 2019-11-24 ENCOUNTER — Ambulatory Visit (INDEPENDENT_AMBULATORY_CARE_PROVIDER_SITE_OTHER): Payer: Medicare HMO

## 2019-11-24 ENCOUNTER — Encounter: Payer: Self-pay | Admitting: Orthopaedic Surgery

## 2019-11-24 ENCOUNTER — Ambulatory Visit: Payer: Medicare HMO | Admitting: Orthopaedic Surgery

## 2019-11-24 VITALS — Ht 68.0 in | Wt 175.0 lb

## 2019-11-24 DIAGNOSIS — M79671 Pain in right foot: Secondary | ICD-10-CM

## 2019-11-24 NOTE — Progress Notes (Signed)
Office Visit Note   Patient: Gary Arroyo           Date of Birth: 1941-03-08           MRN: [id number] Visit Date: 11/24/2019              Requested by: Hulan Fess, MD [place],  [place] [address] PCP: Hulan Fess, MD   Assessment & Plan: Visit Diagnoses:  1. Pain in right foot     Plan: Mr. Gervasi has a 4 to 6-week history of recurrent and episodic lateral right foot pain.  No obvious injury or trauma.  He does like to work out at [employer] and finishes his workout with the about a mile walk.  Pain is been localized along the lateral aspect of his foot.  By exam I suspect he might have some irritation of the peroneus brevis as it attaches to the base of the fifth metatarsal.  He does have a history of gout and that is also a possibility.  Presently he is not that symptomatic but I would suggest Voltaren gel or ibuprofen.  We will place him in an equalizer boot and check him back in a month.  Might want to check his serum uric acid if he continues to have pain  Follow-Up Instructions: Return in about 1 month (around 12/25/2019).   Orders:  Orders Placed This Encounter  Procedures  . XR Foot Complete Right   No orders of the defined types were placed in this encounter.     Procedures: No procedures performed   Clinical Data: No additional findings.   Subjective: Chief Complaint  Patient presents with  . Right Foot - Pain  Patient presents today for right foot pain. He said that it has been hurting for quite awhile, but worsened a month ago. He said that when it got worse he was unable to walk for about 3 days. He has been taking Ibuprofen and that seems to help. His pain is located along the lateral side of his foot. No known injury. He states that it is a little swollen throughout the foot. He only has pain with weightbearing. He does state that it feels like a bruise when touching that area.  No previous surgery to that foot.   HPI  Review of  Systems  Constitutional: Negative for fatigue.  HENT: Negative for ear pain.   Eyes: Negative for pain.  Respiratory: Negative for shortness of breath.   Cardiovascular: Negative for leg swelling.  Gastrointestinal: Negative for constipation and diarrhea.  Endocrine: Negative for cold intolerance and heat intolerance.  Genitourinary: Negative for difficulty urinating.  Musculoskeletal: Negative for joint swelling.  Skin: Negative for rash.  Allergic/Immunologic: Negative for food allergies.  Neurological: Negative for weakness.  Hematological: Does not bruise/bleed easily.  Psychiatric/Behavioral: Negative for sleep disturbance.     Objective: Vital Signs: Ht 5\' 8"  (1.727 m)   Wt 175 lb (79.4 kg)   BMI 26.61 kg/m   Physical Exam Constitutional:      Appearance: He is well-developed.  Eyes:     Pupils: Pupils are equal, round, and reactive to light.  Pulmonary:     Effort: Pulmonary effort is normal.  Skin:    General: Skin is warm and dry.  Neurological:     Mental Status: He is alert and oriented to person, place, and time.  Psychiatric:        Behavior: Behavior normal.     Ortho Exam awake  alert and oriented x3.  Comfortable sitting.  Examination of his right foot reveals some mild tenderness at the very base of the fifth metatarsal.  Peroneus tendons intact.  No problem with inversion eversion of the foot or flexion extension.  Foot was not swollen.  Toes were warm.  Motor intact.  No pain about either malleoli.  Specialty Comments:  No specialty comments available.  Imaging: XR Foot Complete Right  Result Date: 11/24/2019 Films of the right foot were obtained in 3 projections.  There is no evidence of a fracture.  Patient is experiencing pain along the base of the fifth metatarsal.  No ectopic calcification.  No obvious subluxation or dislocation    PMFS History: Patient Active Problem List   Diagnosis Date Noted  . Pain in right foot 11/24/2019   History  reviewed. No pertinent past medical history.  History reviewed. No pertinent family history.  History reviewed. No pertinent surgical history. Social History   Occupational History  . Not on file  Tobacco Use  . Smoking status: Former Smoker    Quit date: 1974    Years since quitting: 47.5  . Smokeless tobacco: Never Used  Vaping Use  . Vaping Use: Never used  Substance and Sexual Activity  . Alcohol use: Yes    Comment: once every 2 years  . Drug use: Not Currently  . Sexual activity: Not Currently

## 2019-12-22 ENCOUNTER — Ambulatory Visit: Payer: Medicare HMO | Admitting: Orthopaedic Surgery

## 2019-12-22 ENCOUNTER — Other Ambulatory Visit: Payer: Self-pay

## 2019-12-22 ENCOUNTER — Encounter: Payer: Self-pay | Admitting: Orthopaedic Surgery

## 2019-12-22 VITALS — Ht 68.0 in | Wt 175.0 lb

## 2019-12-22 DIAGNOSIS — M79671 Pain in right foot: Secondary | ICD-10-CM | POA: Diagnosis not present

## 2019-12-22 NOTE — Progress Notes (Signed)
Office Visit Note   Patient: Gary Arroyo           Date of Birth: 01/29/1941           MRN: [id number] Visit Date: 12/22/2019              Requested by: Hulan Fess, MD [place],  [place] [address] PCP: Hulan Fess, MD   Assessment & Plan: Visit Diagnoses:  1. Pain in right foot     Plan: Gary Arroyo relates that his right foot pain is significantly better with having worn the equalizer boot for the past several weeks.  He no longer has the lateral foot pain but occasionally has some discomfort on the plantar aspect of his heel.  This appears to be consistent with very mild plantar fasciitis.  He will use Voltaren gel and ice.  He is barely symptomatic so I would not suggest he needs to use the equalizer boot.  We will plan to see him back as needed  Follow-Up Instructions: Return if symptoms worsen or fail to improve.   Orders:  No orders of the defined types were placed in this encounter.  No orders of the defined types were placed in this encounter.     Procedures: No procedures performed   Clinical Data: No additional findings.   Subjective: Chief Complaint  Patient presents with  . Right Foot - Follow-up  Patient presents today for follow up on his right foot. He has been walking in a CAM boot and states that it helps. He is not needing to take anything for pain.   HPI  Review of Systems  Constitutional: Negative for fatigue.  HENT: Negative for ear pain.   Eyes: Negative for pain.  Respiratory: Negative for shortness of breath.   Cardiovascular: Negative for leg swelling.  Gastrointestinal: Negative for constipation and diarrhea.  Endocrine: Negative for cold intolerance and heat intolerance.  Genitourinary: Positive for difficulty urinating.  Musculoskeletal: Negative for joint swelling.  Skin: Negative for rash.  Allergic/Immunologic: Negative for food allergies.  Neurological: Negative for weakness.  Hematological: Bruises/bleeds  easily.  Psychiatric/Behavioral: Negative for sleep disturbance.     Objective: Vital Signs: Ht 5\' 8"  (1.727 m)   Wt 175 lb (79.4 kg)   BMI 26.61 kg/m   Physical Exam Constitutional:      Appearance: He is well-developed.  Eyes:     Pupils: Pupils are equal, round, and reactive to light.  Pulmonary:     Effort: Pulmonary effort is normal.  Skin:    General: Skin is warm and dry.  Neurological:     Mental Status: He is alert and oriented to person, place, and time.  Psychiatric:        Behavior: Behavior normal.     Ortho Exam examination of the right foot without any skin changes or swelling.  Good pulses.  Good capillary refill to toes.  No pain along the lateral aspect of his foot.  Good arch and very minimal discomfort over the plantar fascial attachment of the os calcis.  Achilles intact.  No ankle pain  Specialty Comments:  No specialty comments available.  Imaging: No results found.   PMFS History: Patient Active Problem List   Diagnosis Date Noted  . Pain in right foot 11/24/2019   History reviewed. No pertinent past medical history.  History reviewed. No pertinent family history.  History reviewed. No pertinent surgical history. Social History   Occupational History  . Not on file  Tobacco Use  . Smoking status: Former Smoker    Quit date: 1974    Years since quitting: 47.6  . Smokeless tobacco: Never Used  Vaping Use  . Vaping Use: Never used  Substance and Sexual Activity  . Alcohol use: Yes    Comment: once every 2 years  . Drug use: Not Currently  . Sexual activity: Not Currently

## 2020-01-20 DIAGNOSIS — H52223 Regular astigmatism, bilateral: Secondary | ICD-10-CM | POA: Diagnosis not present

## 2020-01-20 DIAGNOSIS — H524 Presbyopia: Secondary | ICD-10-CM | POA: Diagnosis not present

## 2020-03-05 DIAGNOSIS — R69 Illness, unspecified: Secondary | ICD-10-CM | POA: Diagnosis not present

## 2020-03-07 DIAGNOSIS — R69 Illness, unspecified: Secondary | ICD-10-CM | POA: Diagnosis not present

## 2020-03-13 DIAGNOSIS — R69 Illness, unspecified: Secondary | ICD-10-CM | POA: Diagnosis not present

## 2020-03-19 DIAGNOSIS — Z8739 Personal history of other diseases of the musculoskeletal system and connective tissue: Secondary | ICD-10-CM | POA: Diagnosis not present

## 2020-03-19 DIAGNOSIS — Z79899 Other long term (current) drug therapy: Secondary | ICD-10-CM | POA: Diagnosis not present

## 2020-03-19 DIAGNOSIS — E782 Mixed hyperlipidemia: Secondary | ICD-10-CM | POA: Diagnosis not present

## 2020-03-20 DIAGNOSIS — R351 Nocturia: Secondary | ICD-10-CM | POA: Diagnosis not present

## 2020-03-20 DIAGNOSIS — R3912 Poor urinary stream: Secondary | ICD-10-CM | POA: Diagnosis not present

## 2020-03-20 DIAGNOSIS — N401 Enlarged prostate with lower urinary tract symptoms: Secondary | ICD-10-CM | POA: Diagnosis not present

## 2020-03-23 DIAGNOSIS — N400 Enlarged prostate without lower urinary tract symptoms: Secondary | ICD-10-CM | POA: Diagnosis not present

## 2020-03-23 DIAGNOSIS — Z0000 Encounter for general adult medical examination without abnormal findings: Secondary | ICD-10-CM | POA: Diagnosis not present

## 2020-03-23 DIAGNOSIS — Z79899 Other long term (current) drug therapy: Secondary | ICD-10-CM | POA: Diagnosis not present

## 2020-03-23 DIAGNOSIS — Z8739 Personal history of other diseases of the musculoskeletal system and connective tissue: Secondary | ICD-10-CM | POA: Diagnosis not present

## 2020-03-23 DIAGNOSIS — Z8589 Personal history of malignant neoplasm of other organs and systems: Secondary | ICD-10-CM | POA: Diagnosis not present

## 2020-03-23 DIAGNOSIS — N289 Disorder of kidney and ureter, unspecified: Secondary | ICD-10-CM | POA: Diagnosis not present

## 2020-03-23 DIAGNOSIS — R7303 Prediabetes: Secondary | ICD-10-CM | POA: Diagnosis not present

## 2020-03-23 DIAGNOSIS — E782 Mixed hyperlipidemia: Secondary | ICD-10-CM | POA: Diagnosis not present

## 2020-03-23 DIAGNOSIS — R899 Unspecified abnormal finding in specimens from other organs, systems and tissues: Secondary | ICD-10-CM | POA: Diagnosis not present

## 2020-03-23 DIAGNOSIS — D696 Thrombocytopenia, unspecified: Secondary | ICD-10-CM | POA: Diagnosis not present

## 2020-03-26 DIAGNOSIS — H6121 Impacted cerumen, right ear: Secondary | ICD-10-CM | POA: Diagnosis not present

## 2020-03-27 ENCOUNTER — Other Ambulatory Visit: Payer: Self-pay | Admitting: Family Medicine

## 2020-03-27 DIAGNOSIS — N289 Disorder of kidney and ureter, unspecified: Secondary | ICD-10-CM

## 2020-04-10 ENCOUNTER — Ambulatory Visit
Admission: RE | Admit: 2020-04-10 | Discharge: 2020-04-10 | Disposition: A | Discharge status: Home / Self Care | Payer: Medicare HMO | Source: Ambulatory Visit | Attending: Family Medicine | Admitting: Family Medicine

## 2020-04-10 DIAGNOSIS — N1330 Unspecified hydronephrosis: Secondary | ICD-10-CM | POA: Diagnosis not present

## 2020-04-10 DIAGNOSIS — N289 Disorder of kidney and ureter, unspecified: Secondary | ICD-10-CM

## 2020-04-10 DIAGNOSIS — N281 Cyst of kidney, acquired: Secondary | ICD-10-CM | POA: Diagnosis not present

## 2020-04-23 DIAGNOSIS — N401 Enlarged prostate with lower urinary tract symptoms: Secondary | ICD-10-CM | POA: Diagnosis not present

## 2020-04-23 DIAGNOSIS — N130 Hydronephrosis with ureteropelvic junction obstruction: Secondary | ICD-10-CM | POA: Diagnosis not present

## 2020-04-23 DIAGNOSIS — R3912 Poor urinary stream: Secondary | ICD-10-CM | POA: Diagnosis not present

## 2020-04-24 DIAGNOSIS — D225 Melanocytic nevi of trunk: Secondary | ICD-10-CM | POA: Diagnosis not present

## 2020-04-24 DIAGNOSIS — Z85828 Personal history of other malignant neoplasm of skin: Secondary | ICD-10-CM | POA: Diagnosis not present

## 2020-04-24 DIAGNOSIS — C44329 Squamous cell carcinoma of skin of other parts of face: Secondary | ICD-10-CM | POA: Diagnosis not present

## 2020-04-24 DIAGNOSIS — L821 Other seborrheic keratosis: Secondary | ICD-10-CM | POA: Diagnosis not present

## 2020-04-24 DIAGNOSIS — B079 Viral wart, unspecified: Secondary | ICD-10-CM | POA: Diagnosis not present

## 2020-04-24 DIAGNOSIS — L905 Scar conditions and fibrosis of skin: Secondary | ICD-10-CM | POA: Diagnosis not present

## 2020-04-24 DIAGNOSIS — D485 Neoplasm of uncertain behavior of skin: Secondary | ICD-10-CM | POA: Diagnosis not present

## 2020-04-30 DIAGNOSIS — D0439 Carcinoma in situ of skin of other parts of face: Secondary | ICD-10-CM | POA: Diagnosis not present

## 2020-05-02 DIAGNOSIS — N281 Cyst of kidney, acquired: Secondary | ICD-10-CM | POA: Diagnosis not present

## 2020-05-02 DIAGNOSIS — N1330 Unspecified hydronephrosis: Secondary | ICD-10-CM | POA: Diagnosis not present

## 2020-05-02 DIAGNOSIS — Q630 Accessory kidney: Secondary | ICD-10-CM | POA: Diagnosis not present

## 2020-05-02 DIAGNOSIS — K449 Diaphragmatic hernia without obstruction or gangrene: Secondary | ICD-10-CM | POA: Diagnosis not present

## 2020-07-27 DIAGNOSIS — H35033 Hypertensive retinopathy, bilateral: Secondary | ICD-10-CM | POA: Diagnosis not present

## 2020-07-27 DIAGNOSIS — L723 Sebaceous cyst: Secondary | ICD-10-CM | POA: Diagnosis not present

## 2020-07-27 DIAGNOSIS — H40013 Open angle with borderline findings, low risk, bilateral: Secondary | ICD-10-CM | POA: Diagnosis not present

## 2020-07-27 DIAGNOSIS — H2513 Age-related nuclear cataract, bilateral: Secondary | ICD-10-CM | POA: Diagnosis not present

## 2020-08-03 DIAGNOSIS — Z0100 Encounter for examination of eyes and vision without abnormal findings: Secondary | ICD-10-CM | POA: Diagnosis not present

## 2020-09-24 DIAGNOSIS — N289 Disorder of kidney and ureter, unspecified: Secondary | ICD-10-CM | POA: Diagnosis not present

## 2020-09-24 DIAGNOSIS — Z79899 Other long term (current) drug therapy: Secondary | ICD-10-CM | POA: Diagnosis not present

## 2020-09-24 DIAGNOSIS — N400 Enlarged prostate without lower urinary tract symptoms: Secondary | ICD-10-CM | POA: Diagnosis not present

## 2020-09-24 DIAGNOSIS — E782 Mixed hyperlipidemia: Secondary | ICD-10-CM | POA: Diagnosis not present

## 2020-09-24 DIAGNOSIS — Z8589 Personal history of malignant neoplasm of other organs and systems: Secondary | ICD-10-CM | POA: Diagnosis not present

## 2020-09-24 DIAGNOSIS — R7303 Prediabetes: Secondary | ICD-10-CM | POA: Diagnosis not present

## 2020-09-24 DIAGNOSIS — Z8739 Personal history of other diseases of the musculoskeletal system and connective tissue: Secondary | ICD-10-CM | POA: Diagnosis not present

## 2020-09-24 DIAGNOSIS — R899 Unspecified abnormal finding in specimens from other organs, systems and tissues: Secondary | ICD-10-CM | POA: Diagnosis not present

## 2020-09-24 DIAGNOSIS — D696 Thrombocytopenia, unspecified: Secondary | ICD-10-CM | POA: Diagnosis not present

## 2020-09-24 DIAGNOSIS — Z131 Encounter for screening for diabetes mellitus: Secondary | ICD-10-CM | POA: Diagnosis not present

## 2020-10-15 ENCOUNTER — Ambulatory Visit (INDEPENDENT_AMBULATORY_CARE_PROVIDER_SITE_OTHER): Payer: Medicare HMO

## 2020-10-15 ENCOUNTER — Ambulatory Visit (INDEPENDENT_AMBULATORY_CARE_PROVIDER_SITE_OTHER): Payer: Medicare HMO | Admitting: Orthopedic Surgery

## 2020-10-15 ENCOUNTER — Other Ambulatory Visit: Payer: Self-pay

## 2020-10-15 ENCOUNTER — Encounter: Payer: Self-pay | Admitting: Orthopedic Surgery

## 2020-10-15 DIAGNOSIS — M79672 Pain in left foot: Secondary | ICD-10-CM | POA: Diagnosis not present

## 2020-10-15 MED ORDER — SULFAMETHOXAZOLE-TRIMETHOPRIM 800-160 MG PO TABS: 1.0000 | ORAL_TABLET | Freq: Two times a day (BID) | ORAL | 0 refills | Status: DC

## 2020-10-15 MED ORDER — COLCHICINE 0.6 MG PO TABS
0.6000 mg | ORAL_TABLET | Freq: Two times a day (BID) | ORAL | 2 refills | Status: DC
Start: 2020-10-15 — End: 2021-09-27

## 2020-10-15 NOTE — Progress Notes (Signed)
Office Visit Note   Patient: Gary Arroyo           Date of Birth: Feb 02, 1941           MRN: [id number] Visit Date: 10/15/2020              Requested by: Hulan Fess, MD [place],  [place] [address] PCP: Hulan Fess, MD  Chief Complaint  Patient presents with  . Left Foot - Pain      HPI: Patient is a pleasant 80 year old gentleman who presents with a 3-day history of left foot pain and redness and difficulty bearing weight.  He denies any injuries he denies any change in activities.  He does wear shoes all the time.  He is very active and walks 3 miles daily and works out in [employer].  He does have a remote history of gout but states he only ever used to get it in his big toe.  He used to treat it with ibuprofen but had some kidney decline so was told to discontinue this  Assessment & Plan: Visit Diagnoses:  1. Pain in left foot     Plan: We will start patient on colchicine and Bactrim.  I think findings are most likely consistent with gout but would not rule out cellulitis.  Will contact them tomorrow with results of the uric acid  Follow-Up Instructions: No follow-ups on file.   Ortho Exam  Patient is alert, oriented, no adenopathy, well-dressed, normal affect, normal respiratory effort. Examination he has triphasic pulses by Doppler.  On the left foot he does have some redness and swelling no ascending cellulitis.  He is able to plantar plantarflex evert invert no particular area of focal pain although he did say it started in the fifth metatarsal.  Imaging: XR Foot 2 Views Left  Result Date: 10/15/2020 2 views of his foot demonstrate overall well-maintained alignment.  Cannot appreciate any lytic lesions but he does have some narrowing of the first MTP joint.  No signs of acute osseous injuries  No images are attached to the encounter.  Labs: No results found for: HGBA1C, ESRSEDRATE, CRP, LABURIC, REPTSTATUS, GRAMSTAIN, CULT, LABORGA   No results  found for: ALBUMIN, PREALBUMIN, CBC  No results found for: MG No results found for: VD25OH  No results found for: PREALBUMIN No flowsheet data found.   There is no height or weight on file to calculate BMI.  Orders:  Orders Placed This Encounter  Procedures  . XR Foot 2 Views Left  . Uric acid   No orders of the defined types were placed in this encounter.    Procedures: No procedures performed  Clinical Data: No additional findings.  ROS:  All other systems negative, except as noted in the HPI. Review of Systems  Objective: Vital Signs: There were no vitals taken for this visit.  Specialty Comments:  No specialty comments available.  PMFS History: Patient Active Problem List   Diagnosis Date Noted  . Pain in right foot 11/24/2019   No past medical history on file.  No family history on file.  No past surgical history on file. Social History   Occupational History  . Not on file  Tobacco Use  . Smoking status: Former Smoker    Quit date: 1974    Years since quitting: 48.4  . Smokeless tobacco: Never Used  Vaping Use  . Vaping Use: Never used  Substance and Sexual Activity  . Alcohol use: Yes  Comment: once every 2 years  . Drug use: Not Currently  . Sexual activity: Not Currently

## 2020-10-16 LAB — URIC ACID: Uric Acid, Serum: 6.1 mg/dL (ref 4.0–8.0)

## 2020-10-17 ENCOUNTER — Other Ambulatory Visit: Payer: Self-pay | Admitting: Physician Assistant

## 2020-10-17 MED ORDER — ALLOPURINOL 100 MG PO TABS: 100.0000 mg | ORAL_TABLET | Freq: Every day | ORAL | 3 refills | Status: AC

## 2020-10-22 ENCOUNTER — Ambulatory Visit: Payer: Medicare HMO | Admitting: Physician Assistant

## 2020-10-22 ENCOUNTER — Encounter: Payer: Self-pay | Admitting: Orthopedic Surgery

## 2020-10-22 ENCOUNTER — Other Ambulatory Visit: Payer: Self-pay

## 2020-10-22 DIAGNOSIS — M79672 Pain in left foot: Secondary | ICD-10-CM

## 2020-10-22 NOTE — Progress Notes (Signed)
   Office Visit Note   Patient: Gary Arroyo           Date of Birth: January 19, 1941           MRN: [id number] Visit Date: 10/22/2020              Requested by: Hulan Fess, MD [place],  [place] [address] PCP: Hulan Fess, MD  Chief Complaint  Patient presents with   Left Foot - Pain      HPI: Patient presents today in follow-up for his left foot swelling.  He says his pain has all but gone away.  He is taking allopurinol and finishing up some Bactrim.  He also has been looking at what  Assessment & Plan: Visit Diagnoses: No diagnosis found.  Plan: Patient will follow-up in 1 month repeat uric acid should be drawn  Follow-Up Instructions: No follow-ups on file.   Ortho Exam  Patient is alert, oriented, no adenopathy, well-dressed, normal affect, normal respiratory effort.  Examination of his foot and ankle significantly less swelling.  No ascending cellulitis.  Ankle subtalar and foot range of motion is nontender.  Mild tenderness over the great toe pulses palpable no sign of infection  Imaging: No results found. No images are attached to the encounter.  Labs: Lab Results  Component Value Date   LABURIC 6.1 10/15/2020     No results found for: ALBUMIN, PREALBUMIN, CBC  No results found for: MG No results found for: VD25OH  No results found for: PREALBUMIN No flowsheet data found.   There is no height or weight on file to calculate BMI.  Orders:  No orders of the defined types were placed in this encounter.  No orders of the defined types were placed in this encounter.    Procedures: No procedures performed  Clinical Data: No additional findings.  ROS:  All other systems negative, except as noted in the HPI. Review of Systems  Objective: Vital Signs: There were no vitals taken for this visit.  Specialty Comments:  No specialty comments available.  PMFS History: Patient Active Problem List   Diagnosis Date Noted   Pain in  right foot 11/24/2019   No past medical history on file.  No family history on file.  No past surgical history on file. Social History   Occupational History   Not on file  Tobacco Use   Smoking status: Former    Pack years: 0.00    Types: Cigarettes    Quit date: 1974    Years since quitting: 48.4   Smokeless tobacco: Never  Vaping Use   Vaping Use: Never used  Substance and Sexual Activity   Alcohol use: Yes    Comment: once every 2 years   Drug use: Not Currently   Sexual activity: Not Currently

## 2020-10-31 ENCOUNTER — Emergency Department (HOSPITAL_COMMUNITY)
Admission: EM | Admit: 2020-10-31 | Discharge: 2020-10-31 | Disposition: A | Discharge status: Home / Self Care | Payer: Medicare HMO | Attending: Emergency Medicine | Admitting: Emergency Medicine

## 2020-10-31 ENCOUNTER — Encounter (HOSPITAL_COMMUNITY): Payer: Self-pay | Admitting: Emergency Medicine

## 2020-10-31 DIAGNOSIS — Y92410 Unspecified street and highway as the place of occurrence of the external cause: Secondary | ICD-10-CM | POA: Diagnosis not present

## 2020-10-31 DIAGNOSIS — M25511 Pain in right shoulder: Secondary | ICD-10-CM | POA: Diagnosis not present

## 2020-10-31 DIAGNOSIS — Z87891 Personal history of nicotine dependence: Secondary | ICD-10-CM | POA: Insufficient documentation

## 2020-10-31 NOTE — Discharge Instructions (Addendum)
Return for any problem.  ?
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
## 2020-11-26 ENCOUNTER — Ambulatory Visit: Payer: Medicare HMO | Admitting: Physician Assistant

## 2020-11-26 DIAGNOSIS — M79672 Pain in left foot: Secondary | ICD-10-CM

## 2020-11-26 DIAGNOSIS — M1A00X0 Idiopathic chronic gout, unspecified site, without tophus (tophi): Secondary | ICD-10-CM

## 2020-11-26 LAB — URIC ACID: Uric Acid, Serum: 6.7 mg/dL (ref 4.0–8.0)

## 2020-11-26 NOTE — Progress Notes (Signed)
   Office Visit Note   Patient: Gary Arroyo           Date of Birth: May 18, 1940           MRN: [id number] Visit Date: 11/26/2020              Requested by: Hulan Fess, MD [place],  [place] [address] PCP: Hulan Fess, MD  Chief Complaint  Patient presents with   Left Foot - Pain      HPI: Patient is a pleasant 80 year old gentleman who is in follow-up today for his left foot gout.  He is doing better.  He did have another flareup and went back on the colchicine.  Now he is just taking the allopurinol.  Assessment & Plan: Visit Diagnoses: No diagnosis found.  Plan: We will follow-up as needed.  Discussed with them that because of his lower kidney function we certainly do not want him to be having a lot of flareups causing him to use a lot of colchicine.  If he has significant flareups close together he is to follow-up with [place].  We will obtain uric acid today  Follow-Up Instructions: No follow-ups on file.   Ortho Exam  Patient is alert, oriented, no adenopathy, well-dressed, normal affect, normal respiratory effort. Examination of his foot no swelling palpable dorsalis pedis pulse no tenderness with manipulation of the foot midfoot or ankle.  Ankle range of motion is painless.  Imaging: No results found. No images are attached to the encounter.  Labs: Lab Results  Component Value Date   LABURIC 6.1 10/15/2020     No results found for: ALBUMIN, PREALBUMIN, CBC  No results found for: MG No results found for: VD25OH  No results found for: PREALBUMIN No flowsheet data found.   There is no height or weight on file to calculate BMI.  Orders:  No orders of the defined types were placed in this encounter.  No orders of the defined types were placed in this encounter.    Procedures: No procedures performed  Clinical Data: No additional findings.  ROS:  All other systems negative, except as noted in the HPI. Review of  Systems  Objective: Vital Signs: There were no vitals taken for this visit.  Specialty Comments:  No specialty comments available.  PMFS History: Patient Active Problem List   Diagnosis Date Noted   Pain in right foot 11/24/2019   No past medical history on file.  No family history on file.  No past surgical history on file. Social History   Occupational History   Not on file  Tobacco Use   Smoking status: Former    Types: Cigarettes    Quit date: 1974    Years since quitting: 48.5   Smokeless tobacco: Never  Vaping Use   Vaping Use: Never used  Substance and Sexual Activity   Alcohol use: Yes    Comment: once every 2 years   Drug use: Not Currently   Sexual activity: Not Currently

## 2020-11-26 NOTE — Addendum Note (Signed)
Addended byYong Channel, Nefertiti Mohamad L on: 11/26/2020 03:40 PM   Modules accepted: Orders

## 2021-01-22 DIAGNOSIS — H35033 Hypertensive retinopathy, bilateral: Secondary | ICD-10-CM | POA: Diagnosis not present

## 2021-01-22 DIAGNOSIS — H40013 Open angle with borderline findings, low risk, bilateral: Secondary | ICD-10-CM | POA: Diagnosis not present

## 2021-01-22 DIAGNOSIS — H2513 Age-related nuclear cataract, bilateral: Secondary | ICD-10-CM | POA: Diagnosis not present

## 2021-03-21 DIAGNOSIS — N401 Enlarged prostate with lower urinary tract symptoms: Secondary | ICD-10-CM | POA: Diagnosis not present

## 2021-03-21 DIAGNOSIS — R3912 Poor urinary stream: Secondary | ICD-10-CM | POA: Diagnosis not present

## 2021-03-21 DIAGNOSIS — N281 Cyst of kidney, acquired: Secondary | ICD-10-CM | POA: Diagnosis not present

## 2021-04-18 DIAGNOSIS — R7303 Prediabetes: Secondary | ICD-10-CM | POA: Diagnosis not present

## 2021-04-18 DIAGNOSIS — I700 Atherosclerosis of aorta: Secondary | ICD-10-CM | POA: Diagnosis not present

## 2021-04-18 DIAGNOSIS — Z0000 Encounter for general adult medical examination without abnormal findings: Secondary | ICD-10-CM | POA: Diagnosis not present

## 2021-04-18 DIAGNOSIS — Z1331 Encounter for screening for depression: Secondary | ICD-10-CM | POA: Diagnosis not present

## 2021-04-18 DIAGNOSIS — Z1389 Encounter for screening for other disorder: Secondary | ICD-10-CM | POA: Diagnosis not present

## 2021-04-18 DIAGNOSIS — Z79899 Other long term (current) drug therapy: Secondary | ICD-10-CM | POA: Diagnosis not present

## 2021-04-18 DIAGNOSIS — E782 Mixed hyperlipidemia: Secondary | ICD-10-CM | POA: Diagnosis not present

## 2021-04-18 DIAGNOSIS — N400 Enlarged prostate without lower urinary tract symptoms: Secondary | ICD-10-CM | POA: Diagnosis not present

## 2021-04-18 DIAGNOSIS — M109 Gout, unspecified: Secondary | ICD-10-CM | POA: Diagnosis not present

## 2021-04-24 DIAGNOSIS — L84 Corns and callosities: Secondary | ICD-10-CM | POA: Diagnosis not present

## 2021-04-24 DIAGNOSIS — L570 Actinic keratosis: Secondary | ICD-10-CM | POA: Diagnosis not present

## 2021-04-24 DIAGNOSIS — L814 Other melanin hyperpigmentation: Secondary | ICD-10-CM | POA: Diagnosis not present

## 2021-04-24 DIAGNOSIS — D224 Melanocytic nevi of scalp and neck: Secondary | ICD-10-CM | POA: Diagnosis not present

## 2021-04-24 DIAGNOSIS — L821 Other seborrheic keratosis: Secondary | ICD-10-CM | POA: Diagnosis not present

## 2021-04-24 DIAGNOSIS — D225 Melanocytic nevi of trunk: Secondary | ICD-10-CM | POA: Diagnosis not present

## 2021-04-24 DIAGNOSIS — Z872 Personal history of diseases of the skin and subcutaneous tissue: Secondary | ICD-10-CM | POA: Diagnosis not present

## 2021-04-24 DIAGNOSIS — Z85828 Personal history of other malignant neoplasm of skin: Secondary | ICD-10-CM | POA: Diagnosis not present

## 2021-04-24 DIAGNOSIS — Z08 Encounter for follow-up examination after completed treatment for malignant neoplasm: Secondary | ICD-10-CM | POA: Diagnosis not present

## 2021-07-15 DIAGNOSIS — Z23 Encounter for immunization: Secondary | ICD-10-CM | POA: Diagnosis not present

## 2021-08-19 DIAGNOSIS — J069 Acute upper respiratory infection, unspecified: Secondary | ICD-10-CM | POA: Diagnosis not present

## 2021-09-27 ENCOUNTER — Other Ambulatory Visit: Payer: Self-pay | Admitting: Physician Assistant

## 2022-01-15 DIAGNOSIS — J069 Acute upper respiratory infection, unspecified: Secondary | ICD-10-CM | POA: Diagnosis not present

## 2022-01-24 DIAGNOSIS — H04223 Epiphora due to insufficient drainage, bilateral lacrimal glands: Secondary | ICD-10-CM | POA: Diagnosis not present

## 2022-01-24 DIAGNOSIS — H52223 Regular astigmatism, bilateral: Secondary | ICD-10-CM | POA: Diagnosis not present

## 2022-01-24 DIAGNOSIS — H524 Presbyopia: Secondary | ICD-10-CM | POA: Diagnosis not present

## 2022-01-24 DIAGNOSIS — H2513 Age-related nuclear cataract, bilateral: Secondary | ICD-10-CM | POA: Diagnosis not present

## 2022-01-24 DIAGNOSIS — H40013 Open angle with borderline findings, low risk, bilateral: Secondary | ICD-10-CM | POA: Diagnosis not present

## 2022-01-24 DIAGNOSIS — L723 Sebaceous cyst: Secondary | ICD-10-CM | POA: Diagnosis not present

## 2022-02-03 DIAGNOSIS — J40 Bronchitis, not specified as acute or chronic: Secondary | ICD-10-CM | POA: Diagnosis not present

## 2022-04-23 DIAGNOSIS — Z872 Personal history of diseases of the skin and subcutaneous tissue: Secondary | ICD-10-CM | POA: Diagnosis not present

## 2022-04-23 DIAGNOSIS — D224 Melanocytic nevi of scalp and neck: Secondary | ICD-10-CM | POA: Diagnosis not present

## 2022-04-23 DIAGNOSIS — Z08 Encounter for follow-up examination after completed treatment for malignant neoplasm: Secondary | ICD-10-CM | POA: Diagnosis not present

## 2022-04-23 DIAGNOSIS — L814 Other melanin hyperpigmentation: Secondary | ICD-10-CM | POA: Diagnosis not present

## 2022-04-23 DIAGNOSIS — D225 Melanocytic nevi of trunk: Secondary | ICD-10-CM | POA: Diagnosis not present

## 2022-04-23 DIAGNOSIS — L821 Other seborrheic keratosis: Secondary | ICD-10-CM | POA: Diagnosis not present

## 2022-04-23 DIAGNOSIS — L298 Other pruritus: Secondary | ICD-10-CM | POA: Diagnosis not present

## 2022-04-23 DIAGNOSIS — Z85828 Personal history of other malignant neoplasm of skin: Secondary | ICD-10-CM | POA: Diagnosis not present

## 2022-04-24 DIAGNOSIS — I700 Atherosclerosis of aorta: Secondary | ICD-10-CM | POA: Diagnosis not present

## 2022-04-24 DIAGNOSIS — R7303 Prediabetes: Secondary | ICD-10-CM | POA: Diagnosis not present

## 2022-04-24 DIAGNOSIS — N400 Enlarged prostate without lower urinary tract symptoms: Secondary | ICD-10-CM | POA: Diagnosis not present

## 2022-04-24 DIAGNOSIS — Z1331 Encounter for screening for depression: Secondary | ICD-10-CM | POA: Diagnosis not present

## 2022-04-24 DIAGNOSIS — E782 Mixed hyperlipidemia: Secondary | ICD-10-CM | POA: Diagnosis not present

## 2022-04-24 DIAGNOSIS — M109 Gout, unspecified: Secondary | ICD-10-CM | POA: Diagnosis not present

## 2022-04-24 DIAGNOSIS — Z0000 Encounter for general adult medical examination without abnormal findings: Secondary | ICD-10-CM | POA: Diagnosis not present

## 2022-05-08 DIAGNOSIS — E785 Hyperlipidemia, unspecified: Secondary | ICD-10-CM | POA: Diagnosis not present

## 2022-05-08 DIAGNOSIS — M109 Gout, unspecified: Secondary | ICD-10-CM | POA: Diagnosis not present

## 2022-05-08 DIAGNOSIS — N400 Enlarged prostate without lower urinary tract symptoms: Secondary | ICD-10-CM | POA: Diagnosis not present

## 2024-04-12 ENCOUNTER — Encounter: Payer: Self-pay | Admitting: Gastroenterology

## 2024-04-15 ENCOUNTER — Ambulatory Visit (INDEPENDENT_AMBULATORY_CARE_PROVIDER_SITE_OTHER): Payer: Self-pay | Admitting: Gastroenterology

## 2024-04-15 ENCOUNTER — Encounter: Payer: Self-pay | Admitting: Gastroenterology

## 2024-04-15 VITALS — BP 126/70 | HR 77 | Ht 68.0 in | Wt 179.0 lb

## 2024-04-15 DIAGNOSIS — K449 Diaphragmatic hernia without obstruction or gangrene: Secondary | ICD-10-CM | POA: Diagnosis not present

## 2024-04-15 DIAGNOSIS — K225 Diverticulum of esophagus, acquired: Secondary | ICD-10-CM

## 2024-04-15 DIAGNOSIS — R1310 Dysphagia, unspecified: Secondary | ICD-10-CM | POA: Diagnosis not present

## 2024-04-15 DIAGNOSIS — R1319 Other dysphagia: Secondary | ICD-10-CM

## 2024-04-15 NOTE — Progress Notes (Signed)
 Discussed the use of AI scribe software for clinical note transcription with the patient, who gave verbal consent to proceed.  HPI : Gary Arroyo is an 83 year old male who presents with swallowing difficulties. He was referred by a previous doctor for further evaluation of his swallowing difficulties.  He has experienced swallowing difficulties for an extended period, necessitating cutting food into small pieces and thorough chewing. Large vitamins occasionally get stuck, causing coughing. About a month ago, a severe episode occurred where he felt unable to breathe after a vitamin got stuck, prompting medical attention.  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 He had subsequent evaluations at [facility] and further consultations at [facility] regarding his swallowing difficulties. He does not recall having previously consulted an ENT or gastroenterologist specifically for this issue.  He describes the sensation of food feeling 'hung right here' at the top of his esophagus. He has adapted by chewing food finely and sipping water slowly to avoid choking. He stopped taking large vitamins after the choking incident.  No major heart or lung problems. He maintains an active lifestyle, working out five days a week. He experiences occasional heartburn if he eats greasy foods after 4 PM, which is managed with omeprazole taken twice a week.  He has had two normal colonoscopies at ages 62 and 77, with no further need for follow-up. His weight has remained stable, and he has not experienced unintentional weight loss.          Narrative  03/07/2024 12:38 PM EDT  ESOPHAGRAM  INDICATION: Zenker diverticulum  COMPARISON: None  TECHNIQUE: Single contrast esophagram was performed under fluoroscopic guidance with videography. The patient was administered barium contrast suspension contrast by  mouth.  Total Exposure/Cumulative Dose or Air Kerma: 17.36 mGy  FINDINGS: There is normal swallowing function. There is an outpouching at the left lateral posterior aspect of the pharynx ascending from the level of C5-C7 consistent with Zenker's diverticulum. Peristalsis passes normally through the esophagus into the proximal stomach. There is a small sliding hiatal hernia    Past Medical History:  Diagnosis Date   GERD (gastroesophageal reflux disease)    Gout    Hyperlipidemia    Zenker's diverticulum      No past surgical history on file. No family history on file. Social History   Tobacco Use   Smoking status: Former    Current packs/day: 0.00    Types: Cigarettes    Quit date: 1974    Years since quitting: 51.9   Smokeless tobacco: Never  Vaping Use   Vaping status: Never Used  Substance Use Topics   Alcohol use: Yes    Comment: once every 2 years   Drug use: Not Currently   Current Outpatient Medications  Medication Sig Dispense Refill   allopurinol  (ZYLOPRIM ) 100 MG tablet Take 1 tablet (100 mg total) by mouth daily. 60 tablet 3   atorvastatin (LIPITOR) 10 MG tablet Take 10 mg by mouth daily.     colchicine  0.6 MG tablet TAKE ONE TABLET BY MOUTH TWICE A DAY 30 tablet 2   sulfamethoxazole -trimethoprim  (BACTRIM  DS) 800-160 MG tablet Take 1 tablet by mouth 2 (two) times daily. 20 tablet 0   tamsulosin (FLOMAX) 0.4 MG CAPS capsule Take 0.4 mg by mouth.     No current facility-administered medications for this visit.   No Known Allergies   Review  of Systems: All systems reviewed and negative except where noted in HPI.    No results found.  Physical Exam: BP 126/70   Pulse 77   Ht 5' 8 (1.727 m)   Wt 179 lb (81.2 kg)   BMI 27.22 kg/m  Constitutional: Pleasant,well-developed, Caucasian male in no acute distress.  Accompanied by spouse HEENT: Normocephalic and atraumatic. Conjunctivae are normal. No scleral icterus. Neck supple.  Cardiovascular: Normal  rate, regular rhythm.  Pulmonary/chest: Effort normal and breath sounds normal. No wheezing, rales or rhonchi. Abdominal: Soft, nondistended, nontender. Bowel sounds active throughout. There are no masses palpable. No hepatomegaly. Extremities: no edema Lymphadenopathy: No cervical adenopathy noted. Neurological: Alert and oriented to person place and time. Skin: Skin is warm and dry. No rashes noted. Psychiatric: Normal mood and affect. Behavior is normal.  CBC No results found for: WBC, RBC, HGB, HCT, PLT, MCV, MCH, MCHC, RDW, LYMPHSABS, MONOABS, EOSABS, BASOSABS  CMP  No results found for: NA, K, CL, CO2, GLUCOSE, BUN, CREATININE, CALCIUM, PROT, ALBUMIN, AST, ALT, ALKPHOS, BILITOT, GFRNONAA, GFRAA      No data to display            ASSESSMENT AND PLAN:  83 year old male with chronic solid dysphagia, with recent barium esophagram notable for a Zenker's diverticulum, otherwise no luminal narrowing/stenosis, normal motility. Patient's symptoms are most likely related to his diverticulum.  He would be interested in surgical or endoscopic repair of this diverticulum if it were offered.  Although he is 11, he is in good health without any major comorbidities.  Will plan for diagnostic upper endoscopy with empiric dilation.  If patient continues to have bothersome dysphagia following empiric dilation, will refer patient to either ENT or to [facility] therapeutic endoscopy for consideration of treatment for Zenker's.  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 Esophagram normal except for small hiatal hernia. Symptoms significant, intervention requested by patient. - Scheduled upper endoscopy with empiric dilation. - Refer to ENT's [facility] therapeutic endoscopy for potential repair if symptoms persist.  The details, risks  (including bleeding, perforation, infection, missed lesions, medication reactions and possible hospitalization or surgery if complications occur), benefits, and alternatives to EGD with possible biopsy and possible dilation were discussed with the patient and he consents to proceed.   Johna Kearl E. Stacia, MD [facility]  Recording duration: 16 minutes     I spent a total of 35 minutes reviewing the patient's medical record, interviewing and examining the patient, discussing his diagnosis and management of his condition going forward, and documenting in the medical record    Trudy Laverta BIRCH, [place]

## 2024-04-15 NOTE — Patient Instructions (Signed)
 You have been scheduled for an endoscopy. Please follow written instructions given to you at your visit today.  If you use inhalers (even only as needed), please bring them with you on the day of your procedure.  If you take any of the following medications, they will need to be adjusted prior to your procedure:   DO NOT TAKE 7 DAYS PRIOR TO TEST- Trulicity (dulaglutide) Ozempic, Wegovy (semaglutide) Mounjaro, Zepbound (tirzepatide) Bydureon Bcise (exanatide extended release)  DO NOT TAKE 1 DAY PRIOR TO YOUR TEST Rybelsus (semaglutide) Adlyxin (lixisenatide) Victoza (liraglutide) Byetta (exanatide) _____________________________________________________________  _______________________________________________________  If your blood pressure at your visit was 140/90 or greater, please contact your primary care physician to follow up on this.  _______________________________________________________  If you are age 71 or older, your body mass index should be between 23-30. Your Body mass index is 27.22 kg/m. If this is out of the aforementioned range listed, please consider follow up with your Primary Care Provider.  If you are age 42 or younger, your body mass index should be between 19-25. Your Body mass index is 27.22 kg/m. If this is out of the aformentioned range listed, please consider follow up with your Primary Care Provider.   ________________________________________________________  The [facility] providers would like to encourage you to use MYCHART to communicate with providers for non-urgent requests or questions.  Due to long hold times on the telephone, sending your provider a message by [facility] may be a faster and more efficient way to get a response.  Please allow 48 business hours for a response.  Please remember that this is for non-urgent requests.  _______________________________________________________  [facility] is using a team-based approach to  care.  Your team is made up of your doctor and two to three APPS. Our APPS (Nurse Practitioners and Physician Assistants) work with your physician to ensure care continuity for you. They are fully qualified to address your health concerns and develop a treatment plan. They communicate directly with your gastroenterologist to care for you. Seeing the Advanced Practice Practitioners on your physician's team can help you by facilitating care more promptly, often allowing for earlier appointments, access to diagnostic testing, procedures, and other specialty referrals.

## 2024-05-13 ENCOUNTER — Ambulatory Visit: Payer: Self-pay | Admitting: Gastroenterology

## 2024-05-13 ENCOUNTER — Other Ambulatory Visit: Payer: Self-pay | Admitting: Gastroenterology

## 2024-05-13 ENCOUNTER — Encounter: Payer: Self-pay | Admitting: Gastroenterology

## 2024-05-13 VITALS — BP 113/61 | HR 58 | Temp 97.2°F | Resp 20 | Ht 68.0 in | Wt 179.0 lb

## 2024-05-13 DIAGNOSIS — K5710 Diverticulosis of small intestine without perforation or abscess without bleeding: Secondary | ICD-10-CM | POA: Diagnosis not present

## 2024-05-13 DIAGNOSIS — K2289 Other specified disease of esophagus: Secondary | ICD-10-CM

## 2024-05-13 DIAGNOSIS — R1310 Dysphagia, unspecified: Secondary | ICD-10-CM

## 2024-05-13 DIAGNOSIS — K225 Diverticulum of esophagus, acquired: Secondary | ICD-10-CM

## 2024-05-13 DIAGNOSIS — K449 Diaphragmatic hernia without obstruction or gangrene: Secondary | ICD-10-CM

## 2024-05-13 DIAGNOSIS — K208 Other esophagitis without bleeding: Secondary | ICD-10-CM | POA: Diagnosis not present

## 2024-05-13 DIAGNOSIS — R1319 Other dysphagia: Secondary | ICD-10-CM

## 2024-05-13 MED ORDER — SODIUM CHLORIDE 0.9 % IV SOLN: 500.0000 mL | Freq: Once | INTRAVENOUS | Status: DC

## 2024-05-13 NOTE — Patient Instructions (Addendum)
 Follow post dilation diet protocol (handout provided).   Continue present medications.  Await pathology results.  If dysphagia symptoms not improved following dilation, consider endoscopic therapy for Zenker's diverticulum.   YOU HAD AN ENDOSCOPIC PROCEDURE TODAY AT THE [facility]:   Refer to the procedure report that was given to you for any specific questions about what was found during the examination.  If the procedure report does not answer your questions, please call your gastroenterologist to clarify.  If you requested that your care partner not be given the details of your procedure findings, then the procedure report has been included in a sealed envelope for you to review at your convenience later.  YOU SHOULD EXPECT: Some feelings of bloating in the abdomen. Passage of more gas than usual.  Walking can help get rid of the air that was put into your GI tract during the procedure and reduce the bloating. If you had a lower endoscopy (such as a colonoscopy or flexible sigmoidoscopy) you may notice spotting of blood in your stool or on the toilet paper. If you underwent a bowel prep for your procedure, you may not have a normal bowel movement for a few days.  Please Note:  You might notice some irritation and congestion in your nose or some drainage.  This is from the oxygen used during your procedure.  There is no need for concern and it should clear up in a day or so.  SYMPTOMS TO REPORT IMMEDIATELY:  Following upper endoscopy (EGD)  Vomiting of blood or coffee ground material  New chest pain or pain under the shoulder blades  Painful or persistently difficult swallowing  New shortness of breath  Fever of 100F or higher  Black, tarry-looking stools  For urgent or emergent issues, a gastroenterologist can be reached at any hour by calling [phone]. Do not use MyChart messaging for urgent concerns.    DIET:  We do recommend a small meal at first, but then you  may proceed to your regular diet.  Drink plenty of fluids but you should avoid alcoholic beverages for 24 hours.  ACTIVITY:  You should plan to take it easy for the rest of today and you should NOT DRIVE or use heavy machinery until tomorrow (because of the sedation medicines used during the test).    FOLLOW UP: Our staff will call the number listed on your records the next business day following your procedure.  We will call around 7:15- 8:00 am to check on you and address any questions or concerns that you may have regarding the information given to you following your procedure. If we do not reach you, we will leave a message.     If any biopsies were taken you will be contacted by phone or by letter within the next 1-3 weeks.  Please call us  at [phone] if you have not heard about the biopsies in 3 weeks.    SIGNATURES/CONFIDENTIALITY: You and/or your care partner have signed paperwork which will be entered into your electronic medical record.  These signatures attest to the fact that that the information above on your After Visit Summary has been reviewed and is understood.  Full responsibility of the confidentiality of this discharge information lies with you and/or your care-partner.

## 2024-05-13 NOTE — Progress Notes (Signed)
 Sedate, gd SR, tolerated procedure well, VSS, report to RN

## 2024-05-13 NOTE — Progress Notes (Signed)
 History and Physical Interval Note:  05/13/2024 8:13 AM  Gary Arroyo  has presented today for endoscopic procedure(s), with the diagnosis of  Encounter Diagnosis  Name Primary?   Esophageal dysphagia Yes  .  The various methods of evaluation and treatment have been discussed with the patient and/or family. After consideration of risks, benefits and other options for treatment, the patient has consented to  the endoscopic procedure(s).   The patient's history has been reviewed, patient examined, no change in status, stable for endoscopic procedure(s).  I have reviewed the patient's chart and labs.  Questions were answered to the patient's satisfaction.     Aundraya Dripps E. Stacia, MD [facility]

## 2024-05-13 NOTE — Progress Notes (Signed)
 Called to room to assist during endoscopic procedure.  Patient ID and intended procedure confirmed with present staff. Received instructions for my participation in the procedure from the performing physician.

## 2024-05-13 NOTE — Op Note (Signed)
 [facility] Patient Name: Gary Arroyo Procedure Date: 05/13/2024 8:31 AM MRN: [id number] Endoscopist: Glendia E. Stacia , MD, [id number] Age: 84 Referring MD:  Date of Birth: Jul 05, 1940 Gender: Male Account #: [id number] Procedure:                Upper GI endoscopy Indications:              Dysphagia Medicines:                Monitored Anesthesia Care Procedure:                Pre-Anesthesia Assessment:                           - Prior to the procedure, a History and Physical                            was performed, and patient medications and                            allergies were reviewed. The patient's tolerance of                            previous anesthesia was also reviewed. The risks                            and benefits of the procedure and the sedation                            options and risks were discussed with the patient.                            All questions were answered, and informed consent                            was obtained. Prior Anticoagulants: The patient has                            taken no anticoagulant or antiplatelet agents. ASA                            Grade Assessment: III - A patient with severe                            systemic disease. After reviewing the risks and                            benefits, the patient was deemed in satisfactory                            condition to undergo the procedure.                           After obtaining informed consent, the endoscope was  passed under direct vision. Throughout the                            procedure, the patient's blood pressure, pulse, and                            oxygen saturations were monitored continuously. The                            GIF [id number] #[id number] was introduced through the                            mouth, and advanced to the third part of duodenum.                            The upper GI endoscopy was accomplished  without                            difficulty. The patient tolerated the procedure                            well. Scope In: Scope Out: Findings:                 A non-bleeding Zenker's diverticulum with a large                            opening, impacted food and no stigmata of recent                            bleeding was found.                           A guidewire was placed and the scope was withdrawn.                            Dilation was performed in the upper third of the                            esophagus with a Savary dilator with mild                            resistance at 17 mm. A small post dilation effect                            was noted after dilation in the proximal esophagus                            on the wall opposite of the Zenker's diverticulum.                            Estimated blood loss was minimal.  The Z-line was irregular and was found 35 cm from                            the incisors. Mucosa was biopsied with a cold                            forceps for histology in a targeted manner at the                            gastroesophageal junction. One specimen bottle was                            sent to pathology. Estimated blood loss was minimal.                           A 5 cm hiatal hernia was present.                           The entire examined stomach was normal.                           A large non-bleeding diverticulum was found in the                            second portion of the duodenum.                           The exam of the duodenum was otherwise normal. Complications:            No immediate complications. Estimated Blood Loss:     Estimated blood loss was minimal. Impression:               - Zenker's diverticulum.                           - Z-line irregular, 35 cm from the incisors.                            Biopsied.                           - 5 cm hiatal hernia.                            - Normal stomach.                           - Non-bleeding duodenal diverticulum.                           - Dilation performed in the upper third of the                            esophagus. Recommendation:           - Patient has a contact number available for  emergencies. The signs and symptoms of potential                            delayed complications were discussed with the                            patient. Return to normal activities tomorrow.                            Written discharge instructions were provided to the                            patient.                           - Post dilation diet protocol.                           - Continue present medications.                           - Await pathology results.                           - If dysphagia symptoms not improved following                            dilation, consider endoscopic therapy for Zenker's                            diverticulum Gary Arroyo E. Stacia, MD 05/13/2024 8:59:08 AM This report has been signed electronically.

## 2024-05-16 ENCOUNTER — Telehealth: Payer: Self-pay

## 2024-05-16 NOTE — Telephone Encounter (Signed)
 Attempted f/u call. No answer, left VM.

## 2024-05-17 LAB — SURGICAL PATHOLOGY

## 2024-05-18 ENCOUNTER — Ambulatory Visit: Payer: Self-pay | Admitting: Gastroenterology

## 2024-05-18 NOTE — Progress Notes (Signed)
 Mr. 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  This is good news.  Please continue to take your omeprazole as your are doing. Please let us  know if you would like the referral to treat your Zenker's diverticulum.
